# Patient Record
Sex: Male | Born: 1955 | Race: White | Hispanic: No | Marital: Married | Smoking: Former smoker
Health system: Southern US, Community
[De-identification: ages and names within clinical notes are randomized; demographics above are authoritative.]

## PROBLEM LIST (undated history)

## (undated) DIAGNOSIS — M1990 Unspecified osteoarthritis, unspecified site: Secondary | ICD-10-CM

## (undated) DIAGNOSIS — E039 Hypothyroidism, unspecified: Secondary | ICD-10-CM

## (undated) DIAGNOSIS — F209 Schizophrenia, unspecified: Secondary | ICD-10-CM

## (undated) DIAGNOSIS — F319 Bipolar disorder, unspecified: Secondary | ICD-10-CM

## (undated) DIAGNOSIS — E785 Hyperlipidemia, unspecified: Secondary | ICD-10-CM

## (undated) DIAGNOSIS — J449 Chronic obstructive pulmonary disease, unspecified: Secondary | ICD-10-CM

## (undated) HISTORY — DX: Hypothyroidism, unspecified: E03.9

## (undated) HISTORY — DX: Hyperlipidemia, unspecified: E78.5

## (undated) HISTORY — DX: Unspecified osteoarthritis, unspecified site: M19.90

## (undated) HISTORY — DX: Chronic obstructive pulmonary disease, unspecified: J44.9

## (undated) HISTORY — DX: Schizophrenia, unspecified: F20.9

## (undated) HISTORY — DX: Bipolar disorder, unspecified: F31.9

## (undated) HISTORY — PX: THYROIDECTOMY: SHX17

---

## 2003-05-06 ENCOUNTER — Inpatient Hospital Stay (HOSPITAL_COMMUNITY): Admission: AD | Admit: 2003-05-06 | Discharge: 2003-05-10 | Payer: Self-pay | Admitting: Psychiatry

## 2008-03-18 ENCOUNTER — Ambulatory Visit: Payer: Self-pay | Admitting: Psychiatry

## 2008-03-18 ENCOUNTER — Inpatient Hospital Stay (HOSPITAL_COMMUNITY): Admission: AD | Admit: 2008-03-18 | Discharge: 2008-03-29 | Payer: Self-pay | Admitting: Psychiatry

## 2010-05-19 NOTE — Discharge Summary (Signed)
[name], [name] NO.:  [id number]   MEDICAL RECORD NO.:  [id number]          PATIENT TYPE:  IPS   LOCATION:  0406                          FACILITY:  BH   PHYSICIAN:  Anselm Jungling, MD  DATE OF BIRTH:  04-Aug-1955   DATE OF ADMISSION:  03/18/2008  DATE OF DISCHARGE:  03/29/2008                               DISCHARGE SUMMARY   IDENTIFYING DATA AND REASON FOR ADMISSION:  This is an inpatient  psychiatric admission for Dustin Barton, a 55 year old unmarried Caucasian  male who lives by himself in [place], with his parents nearby.  He is a  patient of Dr. Cheree Ditto at [facility].  He was admitted due to  increasing symptoms of his chronic psychotic disorder.  Please refer to  the admission note for further details pertaining to the symptoms,  circumstances and history that led to his hospitalization.  He was given  an initial Axis I diagnosis of schizophrenia, NOS, acute exacerbation.   MEDICAL AND LABORATORY:  The patient has a history of hypothyroidism.  He was medically and physically assessed by the psychiatric nurse  practitioner.  He was in good health without any active or chronic  medical problems.  He was continued on Synthroid 25 mcg daily.  There  were no significant medical issues.   HOSPITAL COURSE:  The patient was admitted to the adult inpatient  psychiatric service.  He presented as a well-nourished, normally-  developed adult male who stated A witch told me I was [employer].  I  hear voices.  The patient was fairly well groomed and dressed,  extremely pleasant and engageable, and friendly.  His ideation was  delusional as above.  His mood was mostly neutral, but there was a  question of possible hypomania, as he was quite animated and mildly  euphoric.  He was agreeable to treatment here, even though his insight  was questionable.  He gave [place] permission to contact his parents in  [place].   He was treated with psychotropic  regimen that included Celexa and  Abilify, which he had been taking previously.   Over the course of his hospital stay, his picture became more apparently  one of moderate hypomania, with persistent euphoria, expansiveness, and  religiosity.  He was extremely pleasant and helpful on the unit, and  showed a great deal of empathy and care for other patients, and was  constantly offering to be helpful in whatever way he could.  He was  never irritable.  He did not verbalize other delusional ideation, but  his thoughts tended to be somewhat rambling and circumstantial.  Because  of this euphoric/hypomanic picture, it was felt that a trial of an  antimanic, mood-stabilizing medication was in order, and as such, a  trial of Depakote was undertaken.  This seemed to sufficiently address  the hypomania and euphoria.  He continued, however, extremely pleasant,  helpful, and engageable.  Sleep and appetite stabilized nicely.   Abilify was 40 mg daily which also was well tolerated and appeared to be  beneficial.  He reported auditory hallucinations at times during his  hospital stay, but they had diminished significantly.   Case management was in touch with the patient's parents.  They indicated  they were not interested in coming to the hospital for a family session.  However, there were in contact with the patient and evidently quite  supportive.   On the 12th hospital day, the patient appeared appropriate for  discharge.  He was in good spirits, but not euphoric or hypomanic any  longer, and his thoughts were better organized.  He appeared to have  more insight.  He agreed with the following aftercare plan.   AFTERCARE:  The patient was to follow up at [facility] with  an appointment on April 01, 2008 at 9 a.m.   DISCHARGE MEDICATIONS:  1. Celexa 20 mg daily.  2. Synthroid 25 mcg daily.  3. Protonix 40 mg daily.  4. Depakote 1000 mg bedtime.  5. Abilify 40 mg q.a.m.    DISCHARGE DIAGNOSES:  Axis I:  Bipolar disorder, not otherwise  specified, most recently hypomanic.  Axis II:  Deferred.  Axis III:  History of hypothyroidism, gastroesophageal reflux disease.  Axis IV:  Stressors, severe.  Axis V:  GAF on discharge 50.   The patient was also referred to therapeutic alternatives for ongoing  community support.      Anselm Jungling, MD  Electronically Signed     SPB/MEDQ  D:  03/29/2008  T:  03/29/2008  Job:  [phone]

## 2010-05-19 NOTE — H&P (Signed)
NAME:  Dustin Barton, Dustin Barton NO.:  [id number]   MEDICAL RECORD NO.:  [id number]          PATIENT TYPE:  IPS   LOCATION:  0402                          FACILITY:  BH   PHYSICIAN:  Anselm Jungling, MD  DATE OF BIRTH:  1955-10-28   DATE OF ADMISSION:  03/18/2008  DATE OF DISCHARGE:                       PSYCHIATRIC ADMISSION ASSESSMENT   HISTORY OF PRESENT ILLNESS:  The patient is here on petition with papers  stating that patient presented to the police department complaining of  hearing voices, saying the voices are telling him to say the devil, not  God, believing the devil is causing the voices and wanting to harm him.  The petition papers also state that law enforcement officers were called  to the [facility] as respondent was having an interaction with an  older man who he thought was homosexual and wanting to hurt him.  The  patient also was talking about Porfirio Oar and [facility].  In  addition, patient's judgment and impulse control are impaired making him  a danger to himself and others.  The patient reports having some recent  medication changes and having some stressors with his neighbors who he  states are using.  He denies any other stressors or any history of  substances.   PAST PSYCHIATRIC HISTORY:  First admission to [facility].  He is a client at [facility] in [place].   SOCIAL HISTORY:  This 55 year old man lives alone in [place].  He is on  disability.   FAMILY HISTORY:  None that we are aware of.   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   PRIMARY CARE Edrian Melucci:  Dr. Celine Mans   MEDICAL PROBLEMS:  Hypothyroidism.   MEDICATIONS:  Listed as the following:  1. Abilify 30 daily.  2. Synthroid 25 mcg daily.  3. Celexa 20 mg.  4. Reporting being on a sleep aid that he is unable to recall the name      of.  He reports compliance with his medications.   DRUG  ALLERGIES:  HALDOL.   PHYSICAL EXAMINATION:  GENERAL:  This is a middle-aged male who was  fully assessed at [facility].  We also note that the patient  received Geodon and Ativan. They state the patient was uncooperative  when he initially presented to the ED, refusing to undress.  RIGHT UPPER EXTREMITY:  His physical exam also showed an open wound to  his right palm near the wrist, and he states he had hurt himself trying  to kill a fly that was [place].  It appears to be healing, no signs  of erythema or any drainage; also noted in [facility] that there  was some discoloration of his arms.   At [facility], the patient denies any distress today.  He  appears well nourished.   The patient also received a tetanus injection.   LABORATORY DATA:  Shows an RPR that is negative.  Urine drug screen is  negative.  WBC count was at 11.  TSH is 1.28.  Alcohol  level was less  than 0.01, and his CMET shows a glucose of 121.   MENTAL STATUS EXAMINATION:  He is cooperative, neat in appearance,  casually dressed.  He is calm, introduces himself.  He has good eye  contact.  His speech is soft-spoken.  The patient's mood is neutral.  He  states he is feeling better today.  Affect:  Again, he is calm and  cooperative.  Thought process:  Delusional thinking, some religious  preoccupation, denies any suicidal ideation.  Cognitive function:  The  patient is aware of self and situation.  Judgment and insight are poor  at this time.   Axis I:  Psychosis, not otherwise specified, rule out schizoaffective  disorder.  Axis II:  Deferred.  Axis III:  Hypothyroidism.  Axis IV:  Other psychosocial problems related to burden of illness.  Axis V:  Current is 25-30.   PLAN:  Our plan is to contract for safety.  Will resume his medications.  Reinforce medication compliance and followup.  Will continue to gather  more history; identify stressors.  Case manager will obtain  follow-up  appointments.  His tentative length of stay at this time is 3-5 days.      Landry Corporal, N.P.      Anselm Jungling, MD  Electronically Signed    JO/MEDQ  D:  03/19/2008  T:  03/19/2008  Job:  [id number]

## 2010-05-22 NOTE — Discharge Summary (Signed)
Dustin Barton, Dustin Barton                          ACCOUNT NO.:  [id number]   MEDICAL RECORD NO.:  [id number]                   PATIENT TYPE:  IPS   LOCATION:  0400                                 FACILITY:  BH   PHYSICIAN:  Geoffery Lyons, M.D.                   DATE OF BIRTH:  January 06, 1955   DATE OF ADMISSION:  05/06/2003  DATE OF DISCHARGE:  05/10/2003                                 DISCHARGE SUMMARY   CHIEF COMPLAINT AND PRESENT ILLNESS:  This was the first admission to [facility] for this 55 year old single white male involuntarily  committed.  History of psychosis, seeing things.  The commitment papers  stated that he was paranoid with visual hallucinations, afraid someone will  throw him in the water to be eaten by sharks.  He apparently was seeing old  friends with hands coming through the coat rack, making deals with them,  feeling paranoid, knew something was wrong, thinking may have taken more of  Wellbutrin after different dosages.  Worried about his cat being alone.  Difficulty with sleep, feeling anxious.   PAST PSYCHIATRIC HISTORY:  First time at [facility].  Sees Dr.  Cheree Ditto.  Attends NA meetings.   ALCOHOL/DRUG HISTORY:  Reported use of Valium.  Other substances but claims  sobriety for 11 years.   MEDICAL HISTORY:  Hypothyroidism.   MEDICATIONS:  Wellbutrin XL 300 mg a day.   PHYSICAL EXAMINATION:  Performed and failed to show any acute findings.   MENTAL STATUS EXAM:  Alert, cooperative male with fair eye contact.  Speech  evidenced some slowing, some psychomotor retardation, some maybe thought-  blocking but mood was anxious.  Affect was anxious.  He was pleasant,  polite.  Thought processes positive for paranoia.  Endorsing positive visual  hallucinations.  Cognition well-preserved.   LABORATORY DATA:  CBC within normal limits.  Blood chemistry within normal  limits.  Liver profile within normal limits.  Drug screen negative for  substances of abuse.  TSH was 4.534.  Lithium, on May 06, 2003, was 0.99 and,  on May 10, 2003, was 1.12.   ADMISSION DIAGNOSES:   AXIS I:  Rule out schizoaffective disorder.   AXIS II:  No diagnosis.   AXIS III:  Hypothyroidism.   AXIS IV:  Moderate.   AXIS V:  Global Assessment of Functioning upon admission 25; highest Global  Assessment of Functioning in the last year 55.   HOSPITAL COURSE:  He was admitted and started intensive individual and group  psychotherapy.  He was maintained on Wellbutrin XL 300 mg in the morning,  Levoxyl 0.025 mg daily, Cogentin 1 mg twice a day as needed, Navane 10 mg in  the morning, 20 mg at night, Ambien 10 mg at bedtime for sleep, lithium 300  mg, 1 three times a day.  The lithium dose was reassessed and he was taking  3  twice a day for a total of 1800 mg.  Initially, he was very superficial,  very disorganized, not focusing on what was going on, what was going on with  his recovery.  He was willing to take the medication.  He was very worried.  Somewhat expansive, some pressured speech.  On May 09, 2003, he was less  disorganized, compliant with medication, was willing to continue them.  Felt  like his family was supportive.  Still concerned about his cat, said he  wanted to be discharged to take care of his cat.  He continued to get better  and, on May 10, 2003, he was in full contact with reality.  Mostly baseline.  No suicidal ideation.  No homicidal ideation.  Endorsed no hallucinations.  Worried about the cat.  Wanted to be discharged to get home and take care of  him.  Endorsed no suicidal or homicidal ideation.  We went ahead and  discharged to outpatient follow-up.   DISCHARGE DIAGNOSES:   AXIS I:  Schizoaffective disorder.   AXIS II:  No diagnosis.   AXIS III:  Hypothyroidism.   AXIS IV:  Moderate.   AXIS V:  Global Assessment of Functioning upon discharge 50.   DISCHARGE MEDICATIONS:  1. Wellbutrin XL 300 mg, 1 daily.  2.  Synthroid 25 mcg daily.  3. Navane 10 mg in the morning and 20 mg at night.  4. Lithium carbonate 300 mg three times a day.  5. Cogentin 1 mg twice a day.  6. Ambien 10 mg at bedtime for sleep.   FOLLOW UP:  [facility].                                               Geoffery Lyons, M.D.    IL/MEDQ  D:  06/05/2003  T:  06/06/2003  Job:  [id number]

## 2011-01-06 DIAGNOSIS — F259 Schizoaffective disorder, unspecified: Secondary | ICD-10-CM | POA: Diagnosis not present

## 2011-02-10 DIAGNOSIS — Z79899 Other long term (current) drug therapy: Secondary | ICD-10-CM | POA: Diagnosis not present

## 2011-02-10 DIAGNOSIS — E039 Hypothyroidism, unspecified: Secondary | ICD-10-CM | POA: Diagnosis not present

## 2011-02-10 DIAGNOSIS — Z125 Encounter for screening for malignant neoplasm of prostate: Secondary | ICD-10-CM | POA: Diagnosis not present

## 2011-02-10 DIAGNOSIS — E785 Hyperlipidemia, unspecified: Secondary | ICD-10-CM | POA: Diagnosis not present

## 2011-02-10 DIAGNOSIS — G40909 Epilepsy, unspecified, not intractable, without status epilepticus: Secondary | ICD-10-CM | POA: Diagnosis not present

## 2011-02-15 DIAGNOSIS — M159 Polyosteoarthritis, unspecified: Secondary | ICD-10-CM | POA: Diagnosis not present

## 2011-02-15 DIAGNOSIS — K7689 Other specified diseases of liver: Secondary | ICD-10-CM | POA: Diagnosis not present

## 2011-02-15 DIAGNOSIS — J449 Chronic obstructive pulmonary disease, unspecified: Secondary | ICD-10-CM | POA: Diagnosis not present

## 2011-02-15 DIAGNOSIS — M722 Plantar fascial fibromatosis: Secondary | ICD-10-CM | POA: Diagnosis not present

## 2011-02-16 DIAGNOSIS — M79609 Pain in unspecified limb: Secondary | ICD-10-CM | POA: Diagnosis not present

## 2011-02-16 DIAGNOSIS — M7730 Calcaneal spur, unspecified foot: Secondary | ICD-10-CM | POA: Diagnosis not present

## 2011-02-16 DIAGNOSIS — M898X9 Other specified disorders of bone, unspecified site: Secondary | ICD-10-CM | POA: Diagnosis not present

## 2011-02-24 DIAGNOSIS — M722 Plantar fascial fibromatosis: Secondary | ICD-10-CM | POA: Diagnosis not present

## 2011-03-15 DIAGNOSIS — J449 Chronic obstructive pulmonary disease, unspecified: Secondary | ICD-10-CM | POA: Diagnosis not present

## 2011-03-15 DIAGNOSIS — K7689 Other specified diseases of liver: Secondary | ICD-10-CM | POA: Diagnosis not present

## 2011-03-15 DIAGNOSIS — M159 Polyosteoarthritis, unspecified: Secondary | ICD-10-CM | POA: Diagnosis not present

## 2011-03-15 DIAGNOSIS — E039 Hypothyroidism, unspecified: Secondary | ICD-10-CM | POA: Diagnosis not present

## 2011-03-24 DIAGNOSIS — M722 Plantar fascial fibromatosis: Secondary | ICD-10-CM | POA: Diagnosis not present

## 2011-05-10 DIAGNOSIS — H2510 Age-related nuclear cataract, unspecified eye: Secondary | ICD-10-CM | POA: Diagnosis not present

## 2011-05-17 DIAGNOSIS — E039 Hypothyroidism, unspecified: Secondary | ICD-10-CM | POA: Diagnosis not present

## 2011-05-17 DIAGNOSIS — E785 Hyperlipidemia, unspecified: Secondary | ICD-10-CM | POA: Diagnosis not present

## 2011-05-17 DIAGNOSIS — I10 Essential (primary) hypertension: Secondary | ICD-10-CM | POA: Diagnosis not present

## 2011-05-17 DIAGNOSIS — Z79899 Other long term (current) drug therapy: Secondary | ICD-10-CM | POA: Diagnosis not present

## 2011-05-17 DIAGNOSIS — K7689 Other specified diseases of liver: Secondary | ICD-10-CM | POA: Diagnosis not present

## 2011-05-28 DIAGNOSIS — F259 Schizoaffective disorder, unspecified: Secondary | ICD-10-CM | POA: Diagnosis not present

## 2011-07-16 DIAGNOSIS — F259 Schizoaffective disorder, unspecified: Secondary | ICD-10-CM | POA: Diagnosis not present

## 2011-08-13 DIAGNOSIS — R5383 Other fatigue: Secondary | ICD-10-CM | POA: Diagnosis not present

## 2011-08-13 DIAGNOSIS — R5381 Other malaise: Secondary | ICD-10-CM | POA: Diagnosis not present

## 2011-08-13 DIAGNOSIS — E669 Obesity, unspecified: Secondary | ICD-10-CM | POA: Diagnosis not present

## 2011-08-13 DIAGNOSIS — G4733 Obstructive sleep apnea (adult) (pediatric): Secondary | ICD-10-CM | POA: Diagnosis not present

## 2011-08-23 DIAGNOSIS — R609 Edema, unspecified: Secondary | ICD-10-CM | POA: Diagnosis not present

## 2011-08-23 DIAGNOSIS — I872 Venous insufficiency (chronic) (peripheral): Secondary | ICD-10-CM | POA: Diagnosis not present

## 2011-08-23 DIAGNOSIS — M722 Plantar fascial fibromatosis: Secondary | ICD-10-CM | POA: Diagnosis not present

## 2011-09-03 DIAGNOSIS — F259 Schizoaffective disorder, unspecified: Secondary | ICD-10-CM | POA: Diagnosis not present

## 2011-10-04 DIAGNOSIS — J209 Acute bronchitis, unspecified: Secondary | ICD-10-CM | POA: Diagnosis not present

## 2011-10-04 DIAGNOSIS — I8310 Varicose veins of unspecified lower extremity with inflammation: Secondary | ICD-10-CM | POA: Diagnosis not present

## 2011-10-04 DIAGNOSIS — F209 Schizophrenia, unspecified: Secondary | ICD-10-CM | POA: Diagnosis not present

## 2011-10-04 DIAGNOSIS — E039 Hypothyroidism, unspecified: Secondary | ICD-10-CM | POA: Diagnosis not present

## 2011-10-04 DIAGNOSIS — R609 Edema, unspecified: Secondary | ICD-10-CM | POA: Diagnosis not present

## 2011-10-04 DIAGNOSIS — Z6841 Body Mass Index (BMI) 40.0 and over, adult: Secondary | ICD-10-CM | POA: Diagnosis not present

## 2011-10-07 DIAGNOSIS — Z6841 Body Mass Index (BMI) 40.0 and over, adult: Secondary | ICD-10-CM | POA: Diagnosis not present

## 2011-10-07 DIAGNOSIS — L270 Generalized skin eruption due to drugs and medicaments taken internally: Secondary | ICD-10-CM | POA: Diagnosis not present

## 2011-10-15 DIAGNOSIS — G4733 Obstructive sleep apnea (adult) (pediatric): Secondary | ICD-10-CM | POA: Diagnosis not present

## 2011-10-15 DIAGNOSIS — Z23 Encounter for immunization: Secondary | ICD-10-CM | POA: Diagnosis not present

## 2011-10-15 DIAGNOSIS — F2089 Other schizophrenia: Secondary | ICD-10-CM | POA: Diagnosis not present

## 2011-10-15 DIAGNOSIS — J449 Chronic obstructive pulmonary disease, unspecified: Secondary | ICD-10-CM | POA: Diagnosis not present

## 2011-10-29 DIAGNOSIS — F259 Schizoaffective disorder, unspecified: Secondary | ICD-10-CM | POA: Diagnosis not present

## 2011-11-26 DIAGNOSIS — F259 Schizoaffective disorder, unspecified: Secondary | ICD-10-CM | POA: Diagnosis not present

## 2011-12-22 DIAGNOSIS — R300 Dysuria: Secondary | ICD-10-CM | POA: Diagnosis not present

## 2011-12-22 DIAGNOSIS — E869 Volume depletion, unspecified: Secondary | ICD-10-CM | POA: Diagnosis not present

## 2011-12-22 DIAGNOSIS — R51 Headache: Secondary | ICD-10-CM | POA: Diagnosis not present

## 2012-01-07 DIAGNOSIS — F259 Schizoaffective disorder, unspecified: Secondary | ICD-10-CM | POA: Diagnosis not present

## 2012-01-12 DIAGNOSIS — E039 Hypothyroidism, unspecified: Secondary | ICD-10-CM | POA: Diagnosis not present

## 2012-01-12 DIAGNOSIS — E785 Hyperlipidemia, unspecified: Secondary | ICD-10-CM | POA: Diagnosis not present

## 2012-01-12 DIAGNOSIS — Z6841 Body Mass Index (BMI) 40.0 and over, adult: Secondary | ICD-10-CM | POA: Diagnosis not present

## 2012-01-12 DIAGNOSIS — Z79899 Other long term (current) drug therapy: Secondary | ICD-10-CM | POA: Diagnosis not present

## 2012-01-12 DIAGNOSIS — F209 Schizophrenia, unspecified: Secondary | ICD-10-CM | POA: Diagnosis not present

## 2012-01-12 DIAGNOSIS — M1990 Unspecified osteoarthritis, unspecified site: Secondary | ICD-10-CM | POA: Diagnosis not present

## 2012-01-12 DIAGNOSIS — J449 Chronic obstructive pulmonary disease, unspecified: Secondary | ICD-10-CM | POA: Diagnosis not present

## 2012-03-17 DIAGNOSIS — F259 Schizoaffective disorder, unspecified: Secondary | ICD-10-CM | POA: Diagnosis not present

## 2012-03-20 DIAGNOSIS — S91109A Unspecified open wound of unspecified toe(s) without damage to nail, initial encounter: Secondary | ICD-10-CM | POA: Diagnosis not present

## 2012-05-10 DIAGNOSIS — J449 Chronic obstructive pulmonary disease, unspecified: Secondary | ICD-10-CM | POA: Diagnosis not present

## 2012-05-10 DIAGNOSIS — E039 Hypothyroidism, unspecified: Secondary | ICD-10-CM | POA: Diagnosis not present

## 2012-05-10 DIAGNOSIS — M1990 Unspecified osteoarthritis, unspecified site: Secondary | ICD-10-CM | POA: Diagnosis not present

## 2012-05-10 DIAGNOSIS — F209 Schizophrenia, unspecified: Secondary | ICD-10-CM | POA: Diagnosis not present

## 2012-05-10 DIAGNOSIS — Z125 Encounter for screening for malignant neoplasm of prostate: Secondary | ICD-10-CM | POA: Diagnosis not present

## 2012-05-10 DIAGNOSIS — Z6841 Body Mass Index (BMI) 40.0 and over, adult: Secondary | ICD-10-CM | POA: Diagnosis not present

## 2012-05-10 DIAGNOSIS — E785 Hyperlipidemia, unspecified: Secondary | ICD-10-CM | POA: Diagnosis not present

## 2012-05-10 DIAGNOSIS — Z79899 Other long term (current) drug therapy: Secondary | ICD-10-CM | POA: Diagnosis not present

## 2012-06-05 DIAGNOSIS — Z1211 Encounter for screening for malignant neoplasm of colon: Secondary | ICD-10-CM | POA: Diagnosis not present

## 2012-06-09 DIAGNOSIS — F259 Schizoaffective disorder, unspecified: Secondary | ICD-10-CM | POA: Diagnosis not present

## 2012-06-20 DIAGNOSIS — Z8371 Family history of colonic polyps: Secondary | ICD-10-CM | POA: Diagnosis not present

## 2012-06-20 DIAGNOSIS — E039 Hypothyroidism, unspecified: Secondary | ICD-10-CM | POA: Diagnosis not present

## 2012-06-20 DIAGNOSIS — Z1211 Encounter for screening for malignant neoplasm of colon: Secondary | ICD-10-CM | POA: Diagnosis not present

## 2012-06-20 DIAGNOSIS — Z7982 Long term (current) use of aspirin: Secondary | ICD-10-CM | POA: Diagnosis not present

## 2012-06-20 DIAGNOSIS — K5730 Diverticulosis of large intestine without perforation or abscess without bleeding: Secondary | ICD-10-CM | POA: Diagnosis not present

## 2012-06-20 DIAGNOSIS — E7800 Pure hypercholesterolemia, unspecified: Secondary | ICD-10-CM | POA: Diagnosis not present

## 2012-06-20 DIAGNOSIS — Z79899 Other long term (current) drug therapy: Secondary | ICD-10-CM | POA: Diagnosis not present

## 2012-06-20 DIAGNOSIS — F209 Schizophrenia, unspecified: Secondary | ICD-10-CM | POA: Diagnosis not present

## 2012-06-20 DIAGNOSIS — Z87891 Personal history of nicotine dependence: Secondary | ICD-10-CM | POA: Diagnosis not present

## 2012-06-20 DIAGNOSIS — K219 Gastro-esophageal reflux disease without esophagitis: Secondary | ICD-10-CM | POA: Diagnosis not present

## 2012-06-20 DIAGNOSIS — K644 Residual hemorrhoidal skin tags: Secondary | ICD-10-CM | POA: Diagnosis not present

## 2012-06-20 DIAGNOSIS — K2270 Barrett's esophagus without dysplasia: Secondary | ICD-10-CM | POA: Diagnosis not present

## 2012-06-20 DIAGNOSIS — K449 Diaphragmatic hernia without obstruction or gangrene: Secondary | ICD-10-CM | POA: Diagnosis not present

## 2012-06-20 DIAGNOSIS — K648 Other hemorrhoids: Secondary | ICD-10-CM | POA: Diagnosis not present

## 2012-06-20 DIAGNOSIS — D126 Benign neoplasm of colon, unspecified: Secondary | ICD-10-CM | POA: Diagnosis not present

## 2012-08-02 DIAGNOSIS — K648 Other hemorrhoids: Secondary | ICD-10-CM | POA: Diagnosis not present

## 2012-08-02 DIAGNOSIS — K5730 Diverticulosis of large intestine without perforation or abscess without bleeding: Secondary | ICD-10-CM | POA: Diagnosis not present

## 2012-08-02 DIAGNOSIS — K2270 Barrett's esophagus without dysplasia: Secondary | ICD-10-CM | POA: Diagnosis not present

## 2012-08-02 DIAGNOSIS — K219 Gastro-esophageal reflux disease without esophagitis: Secondary | ICD-10-CM | POA: Diagnosis not present

## 2012-08-31 DIAGNOSIS — F259 Schizoaffective disorder, unspecified: Secondary | ICD-10-CM | POA: Diagnosis not present

## 2012-09-11 DIAGNOSIS — Z79899 Other long term (current) drug therapy: Secondary | ICD-10-CM | POA: Diagnosis not present

## 2012-09-11 DIAGNOSIS — M1990 Unspecified osteoarthritis, unspecified site: Secondary | ICD-10-CM | POA: Diagnosis not present

## 2012-09-11 DIAGNOSIS — E039 Hypothyroidism, unspecified: Secondary | ICD-10-CM | POA: Diagnosis not present

## 2012-09-11 DIAGNOSIS — F209 Schizophrenia, unspecified: Secondary | ICD-10-CM | POA: Diagnosis not present

## 2012-09-11 DIAGNOSIS — J449 Chronic obstructive pulmonary disease, unspecified: Secondary | ICD-10-CM | POA: Diagnosis not present

## 2012-09-11 DIAGNOSIS — E785 Hyperlipidemia, unspecified: Secondary | ICD-10-CM | POA: Diagnosis not present

## 2012-11-09 DIAGNOSIS — H2510 Age-related nuclear cataract, unspecified eye: Secondary | ICD-10-CM | POA: Diagnosis not present

## 2012-11-16 DIAGNOSIS — F259 Schizoaffective disorder, unspecified: Secondary | ICD-10-CM | POA: Diagnosis not present

## 2012-12-18 DIAGNOSIS — R609 Edema, unspecified: Secondary | ICD-10-CM | POA: Diagnosis not present

## 2012-12-18 DIAGNOSIS — M1990 Unspecified osteoarthritis, unspecified site: Secondary | ICD-10-CM | POA: Diagnosis not present

## 2012-12-18 DIAGNOSIS — M25673 Stiffness of unspecified ankle, not elsewhere classified: Secondary | ICD-10-CM | POA: Diagnosis not present

## 2012-12-18 DIAGNOSIS — M722 Plantar fascial fibromatosis: Secondary | ICD-10-CM | POA: Diagnosis not present

## 2013-01-11 DIAGNOSIS — E039 Hypothyroidism, unspecified: Secondary | ICD-10-CM | POA: Diagnosis not present

## 2013-01-11 DIAGNOSIS — J449 Chronic obstructive pulmonary disease, unspecified: Secondary | ICD-10-CM | POA: Diagnosis not present

## 2013-01-11 DIAGNOSIS — E785 Hyperlipidemia, unspecified: Secondary | ICD-10-CM | POA: Diagnosis not present

## 2013-02-13 ENCOUNTER — Ambulatory Visit (INDEPENDENT_AMBULATORY_CARE_PROVIDER_SITE_OTHER): Payer: Self-pay | Admitting: Critical Care Medicine

## 2013-02-13 ENCOUNTER — Encounter: Payer: Self-pay | Admitting: Critical Care Medicine

## 2013-02-13 VITALS — BP 118/76 | HR 73 | Temp 98.3°F | Ht 69.0 in | Wt 312.0 lb

## 2013-02-13 DIAGNOSIS — J449 Chronic obstructive pulmonary disease, unspecified: Secondary | ICD-10-CM | POA: Diagnosis not present

## 2013-02-13 DIAGNOSIS — M1990 Unspecified osteoarthritis, unspecified site: Secondary | ICD-10-CM | POA: Insufficient documentation

## 2013-02-13 DIAGNOSIS — R0602 Shortness of breath: Secondary | ICD-10-CM | POA: Diagnosis not present

## 2013-02-13 DIAGNOSIS — J4541 Moderate persistent asthma with (acute) exacerbation: Secondary | ICD-10-CM | POA: Insufficient documentation

## 2013-02-13 DIAGNOSIS — E039 Hypothyroidism, unspecified: Secondary | ICD-10-CM | POA: Insufficient documentation

## 2013-02-13 DIAGNOSIS — R059 Cough, unspecified: Secondary | ICD-10-CM | POA: Diagnosis not present

## 2013-02-13 DIAGNOSIS — R05 Cough: Secondary | ICD-10-CM | POA: Diagnosis not present

## 2013-02-13 DIAGNOSIS — F319 Bipolar disorder, unspecified: Secondary | ICD-10-CM | POA: Insufficient documentation

## 2013-02-13 DIAGNOSIS — F209 Schizophrenia, unspecified: Secondary | ICD-10-CM | POA: Insufficient documentation

## 2013-02-13 DIAGNOSIS — R0609 Other forms of dyspnea: Secondary | ICD-10-CM | POA: Diagnosis not present

## 2013-02-13 DIAGNOSIS — E785 Hyperlipidemia, unspecified: Secondary | ICD-10-CM | POA: Insufficient documentation

## 2013-02-13 DIAGNOSIS — M47814 Spondylosis without myelopathy or radiculopathy, thoracic region: Secondary | ICD-10-CM | POA: Diagnosis not present

## 2013-02-13 MED ORDER — TIOTROPIUM BROMIDE MONOHYDRATE 18 MCG IN CAPS: 18.0000 ug | ORAL_CAPSULE | Freq: Every day | RESPIRATORY_TRACT | Status: DC

## 2013-02-13 NOTE — Progress Notes (Signed)
Subjective:    Patient ID: Dustin Barton, male    DOB: April 09, 1955, 58 y.o.   MRN: [id number]  HPI Comments: Dx osa, last ov dx COPD. No Rx as of yet. Not using inhalers as of yet. Pt more dyspneic over several years. Pt did lose weight, now increased.  On nasal oxygen QHS, not on cpap. DME: [facility] Pt  Shortness of Breath This is a chronic problem. The current episode started more than 1 year ago. The problem occurs daily (pt is dyspneic if bend down, or exertion. worse up stairs). The problem has been gradually worsening (over one year). Associated symptoms include chest pain, ear pain, headaches, leg swelling, neck pain, sputum production and wheezing. Pertinent negatives include no abdominal pain, claudication, coryza, fever, hemoptysis, leg pain, orthopnea, PND, rash, rhinorrhea, sore throat, swollen glands, syncope or vomiting. The symptoms are aggravated by weather changes, any activity, exercise, fumes and odors. Associated symptoms comments: Pt gets phlegm, and will get white mucus. Risk factors include smoking. His past medical history is significant for COPD. There is no history of allergies, aspirin allergies, asthma, bronchiolitis, CAD, chronic lung disease, DVT, a heart failure, PE, pneumonia or a recent surgery.    Past Medical History  Diagnosis Date  . Osteoarthritis   . COPD (chronic obstructive pulmonary disease)   . Hypothyroidism   . Schizophrenia   . Hyperlipidemia   . Bipolar 1 disorder      Family History  Problem Relation Age of Onset  . Skin cancer Father   . Diabetes Sister   . Hepatitis Brother   . Lung disease Sister      History   Social History  . Marital Status: Married    Spouse Name: N/A    Number of Children: N/A  . Years of Education: N/A   Occupational History  . Not on file.   Social History Main Topics  . Smoking status: Former Smoker -- 2.00 packs/day for 34 years    Types: Cigarettes    Start date: 02/05/2012  . Smokeless  tobacco: Not on file  . Alcohol Use: No     Comment: quit  . Drug Use: No     Comment: quit   . Sexual Activity: Not on file   Other Topics Concern  . Not on file   Social History Narrative  . No narrative on file     Allergies  Allergen Reactions  . Artane [Trihexyphenidyl]   . Haldol [Haloperidol]      No outpatient prescriptions prior to visit.   No facility-administered medications prior to visit.      Review of Systems  Constitutional: Negative for fever, chills, diaphoresis, activity change, appetite change, fatigue and unexpected weight change.  HENT: Positive for ear pain, postnasal drip, sinus pressure and voice change. Negative for congestion, dental problem, ear discharge, facial swelling, hearing loss, mouth sores, nosebleeds, rhinorrhea, sneezing, sore throat, tinnitus and trouble swallowing.   Eyes: Negative for photophobia, discharge, itching and visual disturbance.  Respiratory: Positive for cough, sputum production, choking, chest tightness, shortness of breath and wheezing. Negative for apnea, hemoptysis and stridor.   Cardiovascular: Positive for chest pain and leg swelling. Negative for palpitations, orthopnea, claudication, syncope and PND.  Gastrointestinal: Negative for nausea, vomiting, abdominal pain, constipation, blood in stool and abdominal distention.  Genitourinary: Negative for dysuria, urgency, frequency, hematuria, flank pain, decreased urine volume and difficulty urinating.  Musculoskeletal: Positive for neck pain. Negative for arthralgias, back pain, gait problem, joint swelling,  myalgias and neck stiffness.  Skin: Negative for color change, pallor and rash.  Neurological: Positive for dizziness, tremors, light-headedness and headaches. Negative for seizures, syncope, speech difficulty, weakness and numbness.  Hematological: Negative for adenopathy. Does not bruise/bleed easily.  Psychiatric/Behavioral: Negative for confusion, sleep  disturbance and agitation. The patient is not nervous/anxious.        Objective:   Physical Exam Filed Vitals:   02/13/13 1448  BP: 118/76  Pulse: 73  Temp: 98.3 F (36.8 C)  TempSrc: Oral  Height: 5\' 9"  (1.753 m)  Weight: 141.522 kg (312 lb)  SpO2: 97%    Gen: Pleasant, obese, in no distress, anxious affect  ENT: No lesions,  mouth clear,  oropharynx clear, no postnasal drip  Neck: No JVD, no TMG, no carotid bruits  Lungs: No use of accessory muscles, no dullness to percussion, distant BS. No wheeze  Cardiovascular: RRR, heart sounds normal, no murmur or gallops, no peripheral edema  Abdomen: soft and NT, no HSM,  BS normal  Musculoskeletal: No deformities, no cyanosis or clubbing  Neuro: alert, non focal  Skin: Warm, no lesions or rashes  No results found.        Assessment & Plan:   COPD (chronic obstructive pulmonary disease) Copd by exam.  No recent PFTs or CXR.  Dyspnea most consistent with obstructive airway disease Plan Start Spiriva daily Obtain CXR and PFTs. Return 2 months   Updated Medication List Outpatient Encounter Prescriptions as of 02/13/2013  Medication Sig  . aspirin 325 MG tablet Take 325 mg by mouth 2 (two) times daily.  . benztropine (COGENTIN) 2 MG tablet Take 1 tablet by mouth 2 (two) times daily.  . citalopram (CELEXA) 20 MG tablet Take 1 tablet by mouth daily.  . cyanocobalamin 2000 MCG tablet Take 2,000 mcg by mouth daily.  . divalproex (DEPAKOTE) 500 MG DR tablet Take 2 tablets by mouth at bedtime.  . fenofibrate micronized (LOFIBRA) 134 MG capsule Take 1 capsule by mouth daily.  Marland Kitchen ibuprofen (ADVIL,MOTRIN) 200 MG tablet Take 200 mg by mouth every 6 (six) hours as needed.  Marland Kitchen levothyroxine (SYNTHROID, LEVOTHROID) 25 MCG tablet Take 1 tablet by mouth daily.  . Multiple Vitamin (MULTIVITAMIN) tablet Take 1 tablet by mouth daily.  . Omega-3 Fatty Acids (FISH OIL) 1200 MG CAPS Take 1 capsule by mouth 2 (two) times daily.  Marland Kitchen  omeprazole (PRILOSEC) 20 MG capsule Take 1 capsule by mouth daily.  . ziprasidone (GEODON) 40 MG capsule Take 1 capsule by mouth daily.  . ziprasidone (GEODON) 80 MG capsule Take 80 mg by mouth at bedtime.  Marland Kitchen tiotropium (SPIRIVA HANDIHALER) 18 MCG inhalation capsule Place 1 capsule (18 mcg total) into inhaler and inhale daily.  Marland Kitchen tiotropium (SPIRIVA) 18 MCG inhalation capsule Place 1 capsule (18 mcg total) into inhaler and inhale daily.

## 2013-02-13 NOTE — Patient Instructions (Signed)
Chest xray today will be done Start Spiriva daily Return 2 months

## 2013-02-15 NOTE — Assessment & Plan Note (Signed)
Copd by exam.  No recent PFTs or CXR.  Dyspnea most consistent with obstructive airway disease Plan Start Spiriva daily Obtain CXR and PFTs. Return 2 months

## 2013-02-23 DIAGNOSIS — J449 Chronic obstructive pulmonary disease, unspecified: Secondary | ICD-10-CM | POA: Diagnosis not present

## 2013-02-23 LAB — PULMONARY FUNCTION TEST

## 2013-03-02 ENCOUNTER — Encounter: Payer: Self-pay | Admitting: *Deleted

## 2013-03-02 ENCOUNTER — Telehealth: Payer: Self-pay | Admitting: Critical Care Medicine

## 2013-03-02 NOTE — Telephone Encounter (Signed)
Dr. Joya Gaskins ordered cxr on pt to be done at [facility]. We have received results from cxr on 02/13/13. Per PW: Call pt and tell him cxr is normal.  Per pt's chart, his # is  [phone] - this # has been disconnected. Called pt's emergency contact at # provided in chart, [phone].  This # is no longer in service. Called Dr. Hale Bogus office, spoke with Jacqlyn Larsen as they are the referring office.  They have a contact # of [phone] - this # is "not valid" and [phone] - Spoke with a lady at this # and was advised I have the wrong #.  Per Jacqlyn Larsen, pt has him mom, Gwenn as the emergency contact.  # they have in his chart for her is [phone].  I called this # - went directly to msg "I'm sry this person has a Voicemail box that has not been set up yet.  Goodbye."    As I have tried to contact pt multiple times with no success, I will have results scanned in pt's chart and will send him a letter to the address we have on file asking to contact our office.

## 2013-03-05 DIAGNOSIS — F259 Schizoaffective disorder, unspecified: Secondary | ICD-10-CM | POA: Diagnosis not present

## 2013-03-09 ENCOUNTER — Telehealth: Payer: Self-pay | Admitting: Critical Care Medicine

## 2013-03-09 NOTE — Telephone Encounter (Signed)
Pt is aware of his CXR results.

## 2013-03-17 ENCOUNTER — Telehealth: Payer: Self-pay | Admitting: Critical Care Medicine

## 2013-03-17 DIAGNOSIS — J449 Chronic obstructive pulmonary disease, unspecified: Secondary | ICD-10-CM

## 2013-03-17 NOTE — Telephone Encounter (Signed)
Call pt and tell him pfts show mild obstruction in small airways that respond to bronchodilators Stay on spiriva

## 2013-03-19 NOTE — Telephone Encounter (Signed)
Called, spoke with pt.  Informed him of PFT results and recs per PW.  He verbalized understanding and voiced no further questions or concerns at this time.  Pt requesting to schedule his 2 mo f/u with PW in [place].  We scheduled this for April 21 at 4:30 pm.  Pt aware.

## 2013-03-30 DIAGNOSIS — F259 Schizoaffective disorder, unspecified: Secondary | ICD-10-CM | POA: Diagnosis not present

## 2013-04-09 ENCOUNTER — Encounter: Payer: Self-pay | Admitting: Critical Care Medicine

## 2013-04-24 ENCOUNTER — Ambulatory Visit: Payer: Self-pay | Admitting: Critical Care Medicine

## 2013-05-11 DIAGNOSIS — E785 Hyperlipidemia, unspecified: Secondary | ICD-10-CM | POA: Diagnosis not present

## 2013-05-11 DIAGNOSIS — E039 Hypothyroidism, unspecified: Secondary | ICD-10-CM | POA: Diagnosis not present

## 2013-05-11 DIAGNOSIS — J449 Chronic obstructive pulmonary disease, unspecified: Secondary | ICD-10-CM | POA: Diagnosis not present

## 2013-05-11 DIAGNOSIS — J4489 Other specified chronic obstructive pulmonary disease: Secondary | ICD-10-CM | POA: Diagnosis not present

## 2013-05-11 DIAGNOSIS — F209 Schizophrenia, unspecified: Secondary | ICD-10-CM | POA: Diagnosis not present

## 2013-05-11 DIAGNOSIS — Z79899 Other long term (current) drug therapy: Secondary | ICD-10-CM | POA: Diagnosis not present

## 2013-05-29 ENCOUNTER — Encounter: Payer: Self-pay | Admitting: Critical Care Medicine

## 2013-05-29 ENCOUNTER — Ambulatory Visit (INDEPENDENT_AMBULATORY_CARE_PROVIDER_SITE_OTHER): Payer: Self-pay | Admitting: Critical Care Medicine

## 2013-05-29 VITALS — BP 132/70 | HR 70 | Temp 98.9°F | Ht 68.5 in | Wt 318.0 lb

## 2013-05-29 DIAGNOSIS — J45909 Unspecified asthma, uncomplicated: Secondary | ICD-10-CM

## 2013-05-29 DIAGNOSIS — J4540 Moderate persistent asthma, uncomplicated: Secondary | ICD-10-CM

## 2013-05-29 MED ORDER — ALBUTEROL SULFATE HFA 108 (90 BASE) MCG/ACT IN AERS: 2.0000 | INHALATION_SPRAY | Freq: Four times a day (QID) | RESPIRATORY_TRACT | Status: DC | PRN

## 2013-05-29 MED ORDER — MOMETASONE FUROATE 220 MCG/INH IN AEPB: 2.0000 | INHALATION_SPRAY | Freq: Every day | RESPIRATORY_TRACT | Status: DC

## 2013-05-29 MED ORDER — ALBUTEROL SULFATE HFA 108 (90 BASE) MCG/ACT IN AERS: 2.0000 | INHALATION_SPRAY | RESPIRATORY_TRACT | Status: DC | PRN

## 2013-05-29 NOTE — Patient Instructions (Signed)
Stop spiriva Start Asmanex two puff daily Use albuterol as needed 2 puff every 4 hours for shortness of breath Stop 325mg  Aspirin Ok to take 81mg  aspirin daily Return 3 months

## 2013-05-29 NOTE — Progress Notes (Signed)
Subjective:    Patient ID: Dustin Barton, male    DOB: 01-20-1955, 58 y.o.   MRN: [id number]  HPI Comments: Dx osa, last ov dx COPD. No Rx as of yet. Not using inhalers as of yet. Pt more dyspneic over several years. Pt did lose weight, now increased.  On nasal oxygen QHS, not on cpap. DME: [facility]   05/29/2013 Chief Complaint  Patient presents with  . 3 month follow up    Breathing is unchanged - has SOB when walking or going up stairs, some chest tightness, and coughing with white phelgm.  Not much change with spiriva.  Still dyspneic with long distance or going up stairs.  Notes more coughing.  Still on oxygen at night.  Pt notes nose is narrowed.   PUL ASTHMA HISTORY 05/29/2013  Symptoms Daily  Nighttime awakenings 0-2/month  Interference with activity Minor limitations  SABA use 0-2 days/wk  Exacerbations requiring oral steroids 0-1 / year      Review of Systems  Constitutional: Negative for chills, diaphoresis, activity change, appetite change, fatigue and unexpected weight change.  HENT: Positive for postnasal drip, sinus pressure and voice change. Negative for congestion, dental problem, ear discharge, facial swelling, hearing loss, mouth sores, nosebleeds, sneezing, tinnitus and trouble swallowing.   Eyes: Negative for photophobia, discharge, itching and visual disturbance.  Respiratory: Positive for cough, choking and chest tightness. Negative for apnea and stridor.   Cardiovascular: Negative for palpitations.  Gastrointestinal: Negative for nausea, constipation, blood in stool and abdominal distention.  Genitourinary: Negative for dysuria, urgency, frequency, hematuria, flank pain, decreased urine volume and difficulty urinating.  Musculoskeletal: Negative for arthralgias, back pain, gait problem, joint swelling, myalgias and neck stiffness.  Skin: Negative for color change and pallor.  Neurological: Positive for dizziness, tremors and light-headedness. Negative  for seizures, syncope, speech difficulty, weakness and numbness.  Hematological: Negative for adenopathy. Does not bruise/bleed easily.  Psychiatric/Behavioral: Negative for confusion, sleep disturbance and agitation. The patient is not nervous/anxious.        Objective:   Physical Exam  Filed Vitals:   05/29/13 1020  BP: 132/70  Pulse: 70  Temp: 98.9 F (37.2 C)  TempSrc: Oral  Height: 5' 8.5" (1.74 m)  Weight: 318 lb (144.244 kg)  SpO2: 96%    Gen: Pleasant, obese, in no distress, anxious affect  ENT: No lesions,  mouth clear,  oropharynx clear, no postnasal drip  Neck: No JVD, no TMG, no carotid bruits  Lungs: No use of accessory muscles, no dullness to percussion, distant BS. No wheeze  Cardiovascular: RRR, heart sounds normal, no murmur or gallops, no peripheral edema  Abdomen: soft and NT, no HSM,  BS normal  Musculoskeletal: No deformities, no cyanosis or clubbing  Neuro: alert, non focal  Skin: Warm, no lesions or rashes  No results found.     Assessment & Plan:   Moderate persistent asthma Mod persis asthma Plan Stop spiriva Start Asmanex two puff daily Use albuterol as needed 2 puff every 4 hours for shortness of breath Stop 325mg  Aspirin Ok to take 81mg  aspirin daily Return 3 months     Updated Medication List Outpatient Encounter Prescriptions as of 05/29/2013  Medication Sig  . benztropine (COGENTIN) 2 MG tablet Take 1 tablet by mouth 2 (two) times daily.  . citalopram (CELEXA) 20 MG tablet Take 1 tablet by mouth daily.  . cyanocobalamin 2000 MCG tablet Take 2,000 mcg by mouth daily.  . divalproex (DEPAKOTE) 500 MG DR tablet  Take 2 tablets by mouth at bedtime.  . fenofibrate micronized (LOFIBRA) 134 MG capsule Take 1 capsule by mouth daily.  Marland Kitchen ibuprofen (ADVIL,MOTRIN) 200 MG tablet Take 200 mg by mouth every 6 (six) hours as needed.  Marland Kitchen levothyroxine (SYNTHROID, LEVOTHROID) 25 MCG tablet Take 1 tablet by mouth daily.  . Multiple Vitamin  (MULTIVITAMIN) tablet Take 1 tablet by mouth daily.  . Omega-3 Fatty Acids (FISH OIL) 1200 MG CAPS Take 1 capsule by mouth 2 (two) times daily.  Marland Kitchen omeprazole (PRILOSEC) 20 MG capsule Take 1 capsule by mouth daily.  . ziprasidone (GEODON) 40 MG capsule Take 1 capsule by mouth daily.  . ziprasidone (GEODON) 80 MG capsule Take 80 mg by mouth at bedtime.  . [DISCONTINUED] aspirin 325 MG tablet Take 325 mg by mouth 2 (two) times daily.  . [DISCONTINUED] tiotropium (SPIRIVA HANDIHALER) 18 MCG inhalation capsule Place 1 capsule (18 mcg total) into inhaler and inhale daily.  . [DISCONTINUED] tiotropium (SPIRIVA) 18 MCG inhalation capsule Place 1 capsule (18 mcg total) into inhaler and inhale daily.  Marland Kitchen albuterol (PROAIR HFA) 108 (90 BASE) MCG/ACT inhaler Inhale 2 puffs into the lungs every 4 (four) hours as needed for wheezing or shortness of breath.  Marland Kitchen albuterol (PROVENTIL HFA;VENTOLIN HFA) 108 (90 BASE) MCG/ACT inhaler Inhale 2 puffs into the lungs every 6 (six) hours as needed for wheezing or shortness of breath.  . mometasone (ASMANEX 120 METERED DOSES) 220 MCG/INH inhaler Inhale 2 puffs into the lungs daily.  . mometasone (ASMANEX 60 METERED DOSES) 220 MCG/INH inhaler Inhale 2 puffs into the lungs daily.

## 2013-05-30 NOTE — Assessment & Plan Note (Signed)
Mod persis asthma Plan Stop spiriva Start Asmanex two puff daily Use albuterol as needed 2 puff every 4 hours for shortness of breath Stop 325mg  Aspirin Ok to take 81mg  aspirin daily Return 3 months

## 2013-06-05 DIAGNOSIS — Z79899 Other long term (current) drug therapy: Secondary | ICD-10-CM | POA: Diagnosis not present

## 2013-06-05 DIAGNOSIS — K219 Gastro-esophageal reflux disease without esophagitis: Secondary | ICD-10-CM | POA: Diagnosis not present

## 2013-06-05 DIAGNOSIS — R109 Unspecified abdominal pain: Secondary | ICD-10-CM | POA: Diagnosis not present

## 2013-06-21 DIAGNOSIS — R1032 Left lower quadrant pain: Secondary | ICD-10-CM | POA: Diagnosis not present

## 2013-06-21 DIAGNOSIS — K219 Gastro-esophageal reflux disease without esophagitis: Secondary | ICD-10-CM | POA: Diagnosis not present

## 2013-06-21 DIAGNOSIS — K2270 Barrett's esophagus without dysplasia: Secondary | ICD-10-CM | POA: Diagnosis not present

## 2013-06-21 DIAGNOSIS — K5730 Diverticulosis of large intestine without perforation or abscess without bleeding: Secondary | ICD-10-CM | POA: Diagnosis not present

## 2013-06-22 DIAGNOSIS — F259 Schizoaffective disorder, unspecified: Secondary | ICD-10-CM | POA: Diagnosis not present

## 2013-06-27 DIAGNOSIS — F259 Schizoaffective disorder, unspecified: Secondary | ICD-10-CM | POA: Diagnosis not present

## 2013-07-04 DIAGNOSIS — F259 Schizoaffective disorder, unspecified: Secondary | ICD-10-CM | POA: Diagnosis not present

## 2013-07-12 DIAGNOSIS — K219 Gastro-esophageal reflux disease without esophagitis: Secondary | ICD-10-CM | POA: Diagnosis not present

## 2013-07-12 DIAGNOSIS — K2270 Barrett's esophagus without dysplasia: Secondary | ICD-10-CM | POA: Diagnosis not present

## 2013-07-12 DIAGNOSIS — K209 Esophagitis, unspecified without bleeding: Secondary | ICD-10-CM | POA: Diagnosis not present

## 2013-07-25 DIAGNOSIS — F259 Schizoaffective disorder, unspecified: Secondary | ICD-10-CM | POA: Diagnosis not present

## 2013-08-07 ENCOUNTER — Encounter: Payer: Self-pay | Admitting: Critical Care Medicine

## 2013-10-02 ENCOUNTER — Ambulatory Visit (INDEPENDENT_AMBULATORY_CARE_PROVIDER_SITE_OTHER): Payer: Self-pay | Admitting: Critical Care Medicine

## 2013-10-02 ENCOUNTER — Encounter: Payer: Self-pay | Admitting: Critical Care Medicine

## 2013-10-02 VITALS — BP 124/72 | HR 81 | Temp 98.0°F | Ht 66.0 in | Wt 317.6 lb

## 2013-10-02 DIAGNOSIS — J45901 Unspecified asthma with (acute) exacerbation: Secondary | ICD-10-CM | POA: Diagnosis not present

## 2013-10-02 DIAGNOSIS — J4541 Moderate persistent asthma with (acute) exacerbation: Secondary | ICD-10-CM

## 2013-10-02 MED ORDER — FLUTICASONE FUROATE-VILANTEROL 200-25 MCG/INH IN AEPB: 1.0000 | INHALATION_SPRAY | Freq: Every day | RESPIRATORY_TRACT | Status: DC

## 2013-10-02 NOTE — Progress Notes (Signed)
Subjective:    Patient ID: Dustin Barton, male    DOB: February 08, 1955, 58 y.o.   MRN: [id number]  HPI 10/02/2013 Chief Complaint  Patient presents with  . Follow-up    SOB with exertion.  Cough with white mucus.  Occas wheezing.   Pt using proair q4h. On asmanex. Not using oxygen.  Still with doe. No chest pain. No f/c/s.   Pt denies any significant sore throat, nasal congestion or excess secretions, fever, chills, sweats, unintended weight loss, pleurtic or exertional chest pain, orthopnea PND, or leg swelling Pt denies any increase in rescue therapy over baseline, denies waking up needing it or having any early am or nocturnal exacerbations of coughing/wheezing/or dyspnea. Pt also denies any obvious fluctuation in symptoms with  weather or environmental change or other alleviating or aggravating factors     Review of Systems Constitutional:   No  weight loss, night sweats,  Fevers, chills, fatigue, lassitude. HEENT:   No headaches,  Difficulty swallowing,  Tooth/dental problems,  Sore throat,                No sneezing, itching, ear ache, nasal congestion, post nasal drip,   CV:  No chest pain,  Orthopnea, PND, swelling in lower extremities, anasarca, dizziness, palpitations  GI  No heartburn, indigestion, abdominal pain, nausea, vomiting, diarrhea, change in bowel habits, loss of appetite  Resp: Notes  shortness of breath with exertion not  at rest.  No excess mucus, no productive cough,  No non-productive cough,  No coughing up of blood.  No change in color of mucus.  No wheezing.  No chest wall deformity  Skin: no rash or lesions.  GU: no dysuria, change in color of urine, no urgency or frequency.  No flank pain.  MS:  No joint pain or swelling.  No decreased range of motion.  No back pain.  Psych:  No change in mood or affect. No depression or anxiety.  No memory loss.     Objective:   Physical Exam  Filed Vitals:   10/02/13 1047  BP: 124/72  Pulse: 81  Temp: 98 F  (36.7 C)  TempSrc: Oral  Height: 5\' 6"  (1.676 m)  Weight: 317 lb 9.6 oz (144.062 kg)  SpO2: 96%    Gen: Pleasant, obese, in no distress,  normal affect  ENT: No lesions,  mouth clear,  oropharynx clear, no postnasal drip  Neck: No JVD, no TMG, no carotid bruits  Lungs: No use of accessory muscles, no dullness to percussion, clear without rales or rhonchi  Cardiovascular: RRR, heart sounds normal, no murmur or gallops, no peripheral edema  Abdomen: soft and NT, no HSM,  BS normal  Musculoskeletal: No deformities, no cyanosis or clubbing  Neuro: alert, non focal  Skin: Warm, no lesions or rashes  No results found.       Assessment & Plan:   Moderate persistent asthma Moderate persistent asthma, failed asmanex Plan Stop Asmanex Start Breo 200 1 puff once daily Use proair as needed only Get Flu Vaccine at [place] Follow up with Dr. Joya Gaskins in 3 months    Updated Medication List Outpatient Encounter Prescriptions as of 10/02/2013  Medication Sig  . albuterol (PROAIR HFA) 108 (90 BASE) MCG/ACT inhaler Inhale 2 puffs into the lungs every 4 (four) hours as needed for wheezing or shortness of breath.  Marland Kitchen aspirin 81 MG tablet Take 81 mg by mouth daily.  . benztropine (COGENTIN) 2 MG tablet Take 1 tablet by mouth  2 (two) times daily.  . citalopram (CELEXA) 20 MG tablet Take 1 tablet by mouth daily.  . cyanocobalamin 2000 MCG tablet Take 2,000 mcg by mouth daily.  . divalproex (DEPAKOTE) 500 MG DR tablet Take 2 tablets by mouth at bedtime.  . fenofibrate micronized (LOFIBRA) 134 MG capsule Take 1 capsule by mouth daily.  Marland Kitchen ibuprofen (ADVIL,MOTRIN) 200 MG tablet Take 200 mg by mouth every 6 (six) hours as needed.  Marland Kitchen levothyroxine (SYNTHROID, LEVOTHROID) 25 MCG tablet Take 1 tablet by mouth daily.  . Multiple Vitamin (MULTIVITAMIN) tablet Take 1 tablet by mouth daily.  . Omega-3 Fatty Acids (FISH OIL) 1200 MG CAPS Take 1 capsule by mouth 2 (two) times daily.  Marland Kitchen  omeprazole (PRILOSEC) 20 MG capsule Take 1 capsule by mouth daily.  . ziprasidone (GEODON) 40 MG capsule Take 1 capsule by mouth daily.  . ziprasidone (GEODON) 80 MG capsule Take 80 mg by mouth at bedtime.  . [DISCONTINUED] albuterol (PROVENTIL HFA;VENTOLIN HFA) 108 (90 BASE) MCG/ACT inhaler Inhale 2 puffs into the lungs every 6 (six) hours as needed for wheezing or shortness of breath.  . Fluticasone Furoate-Vilanterol (BREO ELLIPTA) 200-25 MCG/INH AEPB Inhale 1 puff into the lungs daily.  . [DISCONTINUED] mometasone (ASMANEX 120 METERED DOSES) 220 MCG/INH inhaler Inhale 2 puffs into the lungs daily.  . [DISCONTINUED] mometasone (ASMANEX 60 METERED DOSES) 220 MCG/INH inhaler Inhale 2 puffs into the lungs daily.

## 2013-10-02 NOTE — Patient Instructions (Signed)
Stop Asmanex Start Breo 200 1 puff once daily Use proair as needed only Get Flu Vaccine at [place] Follow up with Dr. Joya Gaskins in 3 months

## 2013-10-02 NOTE — Assessment & Plan Note (Signed)
Moderate persistent asthma, failed asmanex Plan Stop Asmanex Start Breo 200 1 puff once daily Use proair as needed only Get Flu Vaccine at [place] Follow up with Dr. Joya Gaskins in 3 months

## 2013-10-08 DIAGNOSIS — H2589 Other age-related cataract: Secondary | ICD-10-CM | POA: Diagnosis not present

## 2013-10-12 DIAGNOSIS — F250 Schizoaffective disorder, bipolar type: Secondary | ICD-10-CM | POA: Diagnosis not present

## 2013-10-16 ENCOUNTER — Telehealth: Payer: Self-pay | Admitting: Critical Care Medicine

## 2013-10-16 ENCOUNTER — Encounter: Payer: Self-pay | Admitting: Critical Care Medicine

## 2013-10-16 ENCOUNTER — Ambulatory Visit (INDEPENDENT_AMBULATORY_CARE_PROVIDER_SITE_OTHER): Payer: Self-pay | Admitting: Critical Care Medicine

## 2013-10-16 VITALS — BP 134/78 | HR 77 | Temp 98.2°F | Ht 69.0 in | Wt 315.6 lb

## 2013-10-16 DIAGNOSIS — J4541 Moderate persistent asthma with (acute) exacerbation: Secondary | ICD-10-CM

## 2013-10-16 DIAGNOSIS — J45901 Unspecified asthma with (acute) exacerbation: Secondary | ICD-10-CM

## 2013-10-16 MED ORDER — BUDESONIDE-FORMOTEROL FUMARATE 160-4.5 MCG/ACT IN AERO: 2.0000 | INHALATION_SPRAY | Freq: Two times a day (BID) | RESPIRATORY_TRACT | Status: DC

## 2013-10-16 MED ORDER — METHYLPREDNISOLONE ACETATE 80 MG/ML IJ SUSP
120.0000 mg | Freq: Once | INTRAMUSCULAR | Status: AC
  Administered 2013-10-16: 120 mg via INTRAMUSCULAR

## 2013-10-16 NOTE — Assessment & Plan Note (Signed)
Moderate persistent asthma with acute exacerbation Inability to afford breo Plan A depomedrol injection was given 120mg   Stop breo Start symbicort 160 two puff twice daily  Return 1 month

## 2013-10-16 NOTE — Patient Instructions (Addendum)
A depomedrol injection was given 120mg   Stop breo Start symbicort 160 two puff twice daily  Return 1 month

## 2013-10-16 NOTE — Telephone Encounter (Signed)
No message needed.  appt today

## 2013-10-16 NOTE — Progress Notes (Signed)
Subjective:    Patient ID: Dustin Barton, male    DOB: Mar 02, 1955, 58 y.o.   MRN: [id number]  HPI  10/16/2013 Chief Complaint  Patient presents with  . Acute Visit    c/o 2 days ago was going into gas station rainy,"lost breath",did not get Breo at pharmacy "too expensive",wheezing,cough occass.-white,getting dental work,chest tightness,no fcs  Pt cannot afford inhalers.  Pt with more dyspnea and cough with weather changes Pt had dental work recently.  Has proair uses as needed   PUL ASTHMA HISTORY 10/16/2013 10/02/2013 05/29/2013  Symptoms Throughout the day Daily Daily  Nighttime awakenings 3-4/month 3-4/month 0-2/month  Interference with activity Some limitations Some limitations Minor limitations  SABA use Several times/day Several times/day 0-2 days/wk  Exacerbations requiring oral steroids 0-1 / year 0-1 / year 0-1 / year     Review of Systems  Constitutional:   No  weight loss, night sweats,  Fevers, chills, fatigue, lassitude. HEENT:   No headaches,  Difficulty swallowing,  Tooth/dental problems,  Sore throat,                No sneezing, itching, ear ache, nasal congestion, post nasal drip,   CV:  No chest pain,  Orthopnea, PND, swelling in lower extremities, anasarca, dizziness, palpitations  GI  No heartburn, indigestion, abdominal pain, nausea, vomiting, diarrhea, change in bowel habits, loss of appetite  Resp: Notes  shortness of breath with exertion not  at rest.  No excess mucus, no productive cough,  No non-productive cough,  No coughing up of blood.  No change in color of mucus.  No wheezing.  No chest wall deformity  Skin: no rash or lesions.  GU: no dysuria, change in color of urine, no urgency or frequency.  No flank pain.  MS:  No joint pain or swelling.  No decreased range of motion.  No back pain.  Psych:  No change in mood or affect. No depression or anxiety.  No memory loss.     Objective:   Physical Exam   Filed Vitals:   10/16/13 1021  BP:  134/78  Pulse: 77  Temp: 98.2 F (36.8 C)  TempSrc: Oral  Height: 5\' 9"  (1.753 m)  Weight: 315 lb 9.6 oz (143.155 kg)  SpO2: 94%    Gen: Pleasant, obese, in no distress,  normal affect  ENT: No lesions,  mouth clear,  oropharynx clear, no postnasal drip  Neck: No JVD, no TMG, no carotid bruits  Lungs: No use of accessory muscles, no dullness to percussion, clear without rales or rhonchi  Cardiovascular: RRR, heart sounds normal, no murmur or gallops, no peripheral edema  Abdomen: soft and NT, no HSM,  BS normal  Musculoskeletal: No deformities, no cyanosis or clubbing  Neuro: alert, non focal  Skin: Warm, no lesions or rashes  No results found.       Assessment & Plan:   Moderate persistent asthma with acute exacerbation Moderate persistent asthma with acute exacerbation Inability to afford breo Plan A depomedrol injection was given 120mg   Stop breo Start symbicort 160 two puff twice daily  Return 1 month     Updated Medication List Outpatient Encounter Prescriptions as of 10/16/2013  Medication Sig  . albuterol (PROAIR HFA) 108 (90 BASE) MCG/ACT inhaler Inhale 2 puffs into the lungs every 4 (four) hours as needed for wheezing or shortness of breath.  Marland Kitchen aluminum & magnesium hydroxide-simethicone (MYLANTA) 500-450-40 MG/5ML suspension Take 10 mLs by mouth every 6 (six) hours as needed  for indigestion.  Marland Kitchen aspirin 81 MG tablet Take 81 mg by mouth daily.  . benztropine (COGENTIN) 2 MG tablet Take 1 tablet by mouth 2 (two) times daily.  . citalopram (CELEXA) 20 MG tablet Take 1 tablet by mouth daily.  . cyanocobalamin 2000 MCG tablet Take 2,000 mcg by mouth daily.  . divalproex (DEPAKOTE) 500 MG DR tablet Take 2 tablets by mouth at bedtime.  . fenofibrate micronized (LOFIBRA) 134 MG capsule Take 1 capsule by mouth daily.  Marland Kitchen ibuprofen (ADVIL,MOTRIN) 200 MG tablet Take 200 mg by mouth every 6 (six) hours as needed.  Marland Kitchen levothyroxine (SYNTHROID, LEVOTHROID) 25 MCG  tablet Take 1 tablet by mouth daily.  . Multiple Vitamin (MULTIVITAMIN) tablet Take 1 tablet by mouth daily.  . Omega-3 Fatty Acids (FISH OIL) 1200 MG CAPS Take 1 capsule by mouth 2 (two) times daily.  Marland Kitchen omeprazole (PRILOSEC) 20 MG capsule Take 1 capsule by mouth daily.  . psyllium (METAMUCIL) 58.6 % powder Take 1 packet by mouth.  . ziprasidone (GEODON) 40 MG capsule Take 1 capsule by mouth. Take 1 tablet at bedtime. Take 1 tablet daily as needed.  . budesonide-formoterol (SYMBICORT) 160-4.5 MCG/ACT inhaler Inhale 2 puffs into the lungs 2 (two) times daily.  . budesonide-formoterol (SYMBICORT) 160-4.5 MCG/ACT inhaler Inhale 2 puffs into the lungs 2 (two) times daily.  . [DISCONTINUED] Fluticasone Furoate-Vilanterol (BREO ELLIPTA) 200-25 MCG/INH AEPB Inhale 1 puff into the lungs daily.  . [DISCONTINUED] ziprasidone (GEODON) 80 MG capsule Take 80 mg by mouth at bedtime.  . [EXPIRED] methylPREDNISolone acetate (DEPO-MEDROL) injection 120 mg

## 2013-10-17 DIAGNOSIS — Z79899 Other long term (current) drug therapy: Secondary | ICD-10-CM | POA: Diagnosis not present

## 2013-10-17 DIAGNOSIS — E039 Hypothyroidism, unspecified: Secondary | ICD-10-CM | POA: Diagnosis not present

## 2013-10-17 DIAGNOSIS — J449 Chronic obstructive pulmonary disease, unspecified: Secondary | ICD-10-CM | POA: Diagnosis not present

## 2013-10-17 DIAGNOSIS — F209 Schizophrenia, unspecified: Secondary | ICD-10-CM | POA: Diagnosis not present

## 2013-10-17 DIAGNOSIS — M1990 Unspecified osteoarthritis, unspecified site: Secondary | ICD-10-CM | POA: Diagnosis not present

## 2013-10-17 DIAGNOSIS — Z23 Encounter for immunization: Secondary | ICD-10-CM | POA: Diagnosis not present

## 2013-10-30 DIAGNOSIS — R21 Rash and other nonspecific skin eruption: Secondary | ICD-10-CM | POA: Diagnosis not present

## 2013-11-02 DIAGNOSIS — R21 Rash and other nonspecific skin eruption: Secondary | ICD-10-CM | POA: Diagnosis not present

## 2013-11-12 DIAGNOSIS — I8310 Varicose veins of unspecified lower extremity with inflammation: Secondary | ICD-10-CM | POA: Diagnosis not present

## 2013-11-12 DIAGNOSIS — L853 Xerosis cutis: Secondary | ICD-10-CM | POA: Diagnosis not present

## 2013-11-12 DIAGNOSIS — R600 Localized edema: Secondary | ICD-10-CM | POA: Diagnosis not present

## 2013-11-20 ENCOUNTER — Encounter: Payer: Self-pay | Admitting: Critical Care Medicine

## 2013-11-20 ENCOUNTER — Ambulatory Visit (INDEPENDENT_AMBULATORY_CARE_PROVIDER_SITE_OTHER): Payer: Medicaid Other | Admitting: Critical Care Medicine

## 2013-11-20 VITALS — BP 104/64 | HR 71 | Temp 96.9°F | Ht 69.0 in | Wt 318.0 lb

## 2013-11-20 DIAGNOSIS — J4541 Moderate persistent asthma with (acute) exacerbation: Secondary | ICD-10-CM

## 2013-11-20 DIAGNOSIS — J45901 Unspecified asthma with (acute) exacerbation: Secondary | ICD-10-CM

## 2013-11-20 MED ORDER — BUDESONIDE-FORMOTEROL FUMARATE 160-4.5 MCG/ACT IN AERO: 2.0000 | INHALATION_SPRAY | Freq: Two times a day (BID) | RESPIRATORY_TRACT | Status: DC

## 2013-11-20 MED ORDER — ALBUTEROL SULFATE HFA 108 (90 BASE) MCG/ACT IN AERS: 2.0000 | INHALATION_SPRAY | RESPIRATORY_TRACT | Status: AC | PRN

## 2013-11-20 NOTE — Progress Notes (Signed)
Subjective:    Patient ID: Dustin Barton, male    DOB: 01-09-1955, 58 y.o.   MRN: [id number]  HPI  11/20/2013 Chief Complaint  Patient presents with  . Follow-up    Feeling better, sinus pr. x 1 wk.,pnd,sorethroat occass.,sob same, wheezing occass.,no cough usually   At last ov we gave depo injection. Changed to symbicort, could not afford Breo. Recent dental work, had to drill holes.  Using ibuprofen for pain.  Using albuterol about 3-4 x per day.   Min cough, min dyspnea.  Pt denies any significant sore throat, nasal congestion or excess secretions, fever, chills, sweats, unintended weight loss, pleurtic or exertional chest pain, orthopnea PND, or leg swelling Pt denies any increase in rescue therapy over baseline, denies waking up needing it or having any early am or nocturnal exacerbations of coughing/wheezing/or dyspnea. Pt also denies any obvious fluctuation in symptoms with  weather or environmental change or other alleviating or aggravating factors   PUL ASTHMA HISTORY 11/20/2013 10/16/2013 10/02/2013 05/29/2013  Symptoms Daily Throughout the day Daily Daily  Nighttime awakenings 0-2/month 3-4/month 3-4/month 0-2/month  Interference with activity Minor limitations Some limitations Some limitations Minor limitations  SABA use Several times/day Several times/day Several times/day 0-2 days/wk  Exacerbations requiring oral steroids 0-1 / year 0-1 / year 0-1 / year 0-1 / year     Review of Systems Constitutional:   No  weight loss, night sweats,  Fevers, chills, fatigue, lassitude. HEENT:   No headaches,  Difficulty swallowing,  Tooth/dental problems,  Sore throat,                No sneezing, itching, ear ache, nasal congestion, post nasal drip,   CV:  No chest pain,  Orthopnea, PND, swelling in lower extremities, anasarca, dizziness, palpitations  GI  No heartburn, indigestion, abdominal pain, nausea, vomiting, diarrhea, change in bowel habits, loss of appetite  Resp: Notes   shortness of breath with exertion not  at rest.  No excess mucus, no productive cough,  No non-productive cough,  No coughing up of blood.  No change in color of mucus.  No wheezing.  No chest wall deformity  Skin: no rash or lesions.  GU: no dysuria, change in color of urine, no urgency or frequency.  No flank pain.  MS:  No joint pain or swelling.  No decreased range of motion.  No back pain.  Psych:  No change in mood or affect. No depression or anxiety.  No memory loss.     Objective:   Physical Exam  Filed Vitals:   11/20/13 0913  BP: 104/64  Pulse: 71  Temp: 96.9 F (36.1 C)  TempSrc: Oral  Height: 5\' 9"  (1.753 m)  Weight: 318 lb (144.244 kg)  SpO2: 95%    Gen: Pleasant, obese, in no distress,  normal affect  ENT: No lesions,  mouth clear,  oropharynx clear, no postnasal drip  Neck: No JVD, no TMG, no carotid bruits  Lungs: No use of accessory muscles, no dullness to percussion, clear without rales or rhonchi  Cardiovascular: RRR, heart sounds normal, no murmur or gallops, no peripheral edema  Abdomen: soft and NT, no HSM,  BS normal  Musculoskeletal: No deformities, no cyanosis or clubbing  Neuro: alert, non focal  Skin: Warm, no lesions or rashes  No results found.       Assessment & Plan:   Moderate persistent asthma with acute exacerbation Stable moderate persistent asthma Plan No change in inhaled or maintenance medications.  Return in  4 months    Updated Medication List Outpatient Encounter Prescriptions as of 11/20/2013  Medication Sig  . albuterol (PROAIR HFA) 108 (90 BASE) MCG/ACT inhaler Inhale 2 puffs into the lungs every 4 (four) hours as needed for wheezing or shortness of breath.  Marland Kitchen aluminum & magnesium hydroxide-simethicone (MYLANTA) 500-450-40 MG/5ML suspension Take 10 mLs by mouth every 6 (six) hours as needed for indigestion.  Marland Kitchen aspirin 81 MG tablet Take 81 mg by mouth daily.  . benztropine (COGENTIN) 2 MG tablet Take 1 tablet  by mouth 2 (two) times daily.  . budesonide-formoterol (SYMBICORT) 160-4.5 MCG/ACT inhaler Inhale 2 puffs into the lungs 2 (two) times daily.  . citalopram (CELEXA) 20 MG tablet Take 1 tablet by mouth daily.  . cyanocobalamin 2000 MCG tablet Take 2,000 mcg by mouth daily.  . divalproex (DEPAKOTE) 500 MG DR tablet Take 2 tablets by mouth at bedtime.  . fenofibrate micronized (LOFIBRA) 134 MG capsule Take 1 capsule by mouth daily.  Marland Kitchen ibuprofen (ADVIL,MOTRIN) 200 MG tablet Take 200 mg by mouth every 6 (six) hours as needed.  Marland Kitchen levothyroxine (SYNTHROID, LEVOTHROID) 25 MCG tablet Take 1 tablet by mouth daily.  . magnesium 30 MG tablet Take 30 mg by mouth. Daily by mouth.  . Multiple Vitamin (MULTIVITAMIN) tablet Take 1 tablet by mouth daily.  . Omega-3 Fatty Acids (FISH OIL) 1200 MG CAPS Take 1 capsule by mouth 2 (two) times daily.  Marland Kitchen omeprazole (PRILOSEC) 20 MG capsule Take 1 capsule by mouth daily.  . psyllium (METAMUCIL) 58.6 % powder Take 1 packet by mouth.  . ziprasidone (GEODON) 40 MG capsule Take 1 capsule by mouth. Take 1 tablet at bedtime. Take 1 tablet daily as needed.  . [DISCONTINUED] albuterol (PROAIR HFA) 108 (90 BASE) MCG/ACT inhaler Inhale 2 puffs into the lungs every 4 (four) hours as needed for wheezing or shortness of breath.  . [DISCONTINUED] budesonide-formoterol (SYMBICORT) 160-4.5 MCG/ACT inhaler Inhale 2 puffs into the lungs 2 (two) times daily.  . [DISCONTINUED] budesonide-formoterol (SYMBICORT) 160-4.5 MCG/ACT inhaler Inhale 2 puffs into the lungs 2 (two) times daily.

## 2013-11-20 NOTE — Patient Instructions (Signed)
Stay on symbicort two puff twice daily Use proair as needed Stop asmanex Return 4 months I recommend you see an oral surgeon for your bottom front teeth

## 2013-11-20 NOTE — Assessment & Plan Note (Signed)
Stable moderate persistent asthma Plan No change in inhaled or maintenance medications. Return in  4 months 

## 2013-11-21 DIAGNOSIS — R252 Cramp and spasm: Secondary | ICD-10-CM | POA: Diagnosis not present

## 2013-12-04 DIAGNOSIS — R600 Localized edema: Secondary | ICD-10-CM | POA: Diagnosis not present

## 2013-12-04 DIAGNOSIS — I8310 Varicose veins of unspecified lower extremity with inflammation: Secondary | ICD-10-CM | POA: Diagnosis not present

## 2013-12-08 DIAGNOSIS — Z7982 Long term (current) use of aspirin: Secondary | ICD-10-CM | POA: Diagnosis not present

## 2013-12-08 DIAGNOSIS — X58XXXA Exposure to other specified factors, initial encounter: Secondary | ICD-10-CM | POA: Diagnosis not present

## 2013-12-08 DIAGNOSIS — E039 Hypothyroidism, unspecified: Secondary | ICD-10-CM | POA: Diagnosis not present

## 2013-12-08 DIAGNOSIS — E780 Pure hypercholesterolemia: Secondary | ICD-10-CM | POA: Diagnosis not present

## 2013-12-08 DIAGNOSIS — I10 Essential (primary) hypertension: Secondary | ICD-10-CM | POA: Diagnosis not present

## 2013-12-08 DIAGNOSIS — S0501XA Injury of conjunctiva and corneal abrasion without foreign body, right eye, initial encounter: Secondary | ICD-10-CM | POA: Diagnosis not present

## 2013-12-08 DIAGNOSIS — Z87891 Personal history of nicotine dependence: Secondary | ICD-10-CM | POA: Diagnosis not present

## 2013-12-13 DIAGNOSIS — S0501XA Injury of conjunctiva and corneal abrasion without foreign body, right eye, initial encounter: Secondary | ICD-10-CM | POA: Diagnosis not present

## 2014-01-09 DIAGNOSIS — F250 Schizoaffective disorder, bipolar type: Secondary | ICD-10-CM | POA: Diagnosis not present

## 2014-01-15 DIAGNOSIS — J309 Allergic rhinitis, unspecified: Secondary | ICD-10-CM | POA: Diagnosis not present

## 2014-01-15 DIAGNOSIS — R1031 Right lower quadrant pain: Secondary | ICD-10-CM | POA: Diagnosis not present

## 2014-01-15 DIAGNOSIS — K4090 Unilateral inguinal hernia, without obstruction or gangrene, not specified as recurrent: Secondary | ICD-10-CM | POA: Diagnosis not present

## 2014-01-15 DIAGNOSIS — R109 Unspecified abdominal pain: Secondary | ICD-10-CM | POA: Diagnosis not present

## 2014-01-22 DIAGNOSIS — K4090 Unilateral inguinal hernia, without obstruction or gangrene, not specified as recurrent: Secondary | ICD-10-CM | POA: Diagnosis not present

## 2014-01-31 DIAGNOSIS — R1011 Right upper quadrant pain: Secondary | ICD-10-CM | POA: Diagnosis not present

## 2014-01-31 DIAGNOSIS — R1031 Right lower quadrant pain: Secondary | ICD-10-CM | POA: Diagnosis not present

## 2014-01-31 DIAGNOSIS — F3160 Bipolar disorder, current episode mixed, unspecified: Secondary | ICD-10-CM | POA: Diagnosis not present

## 2014-02-04 DIAGNOSIS — R1011 Right upper quadrant pain: Secondary | ICD-10-CM | POA: Diagnosis not present

## 2014-02-04 DIAGNOSIS — R1031 Right lower quadrant pain: Secondary | ICD-10-CM | POA: Diagnosis not present

## 2014-02-05 DIAGNOSIS — I8310 Varicose veins of unspecified lower extremity with inflammation: Secondary | ICD-10-CM | POA: Diagnosis not present

## 2014-02-14 DIAGNOSIS — R1031 Right lower quadrant pain: Secondary | ICD-10-CM | POA: Diagnosis not present

## 2014-02-14 DIAGNOSIS — F3160 Bipolar disorder, current episode mixed, unspecified: Secondary | ICD-10-CM | POA: Diagnosis not present

## 2014-02-14 DIAGNOSIS — F259 Schizoaffective disorder, unspecified: Secondary | ICD-10-CM | POA: Diagnosis not present

## 2014-02-19 ENCOUNTER — Ambulatory Visit: Payer: Medicaid Other | Admitting: Critical Care Medicine

## 2014-02-19 DIAGNOSIS — F209 Schizophrenia, unspecified: Secondary | ICD-10-CM | POA: Diagnosis not present

## 2014-02-19 DIAGNOSIS — M1990 Unspecified osteoarthritis, unspecified site: Secondary | ICD-10-CM | POA: Diagnosis not present

## 2014-02-19 DIAGNOSIS — E039 Hypothyroidism, unspecified: Secondary | ICD-10-CM | POA: Diagnosis not present

## 2014-02-19 DIAGNOSIS — Z6841 Body Mass Index (BMI) 40.0 and over, adult: Secondary | ICD-10-CM | POA: Diagnosis not present

## 2014-02-19 DIAGNOSIS — J449 Chronic obstructive pulmonary disease, unspecified: Secondary | ICD-10-CM | POA: Diagnosis not present

## 2014-02-19 DIAGNOSIS — Z79899 Other long term (current) drug therapy: Secondary | ICD-10-CM | POA: Diagnosis not present

## 2014-02-19 DIAGNOSIS — Z1389 Encounter for screening for other disorder: Secondary | ICD-10-CM | POA: Diagnosis not present

## 2014-02-26 ENCOUNTER — Ambulatory Visit: Payer: Medicaid Other | Admitting: Critical Care Medicine

## 2014-04-03 DIAGNOSIS — F250 Schizoaffective disorder, bipolar type: Secondary | ICD-10-CM | POA: Diagnosis not present

## 2014-04-16 ENCOUNTER — Encounter: Payer: Self-pay | Admitting: Critical Care Medicine

## 2014-04-16 ENCOUNTER — Ambulatory Visit (INDEPENDENT_AMBULATORY_CARE_PROVIDER_SITE_OTHER): Payer: Medicaid Other | Admitting: Critical Care Medicine

## 2014-04-16 VITALS — BP 112/78 | HR 73 | Temp 97.2°F | Ht 69.0 in | Wt 310.2 lb

## 2014-04-16 DIAGNOSIS — Z23 Encounter for immunization: Secondary | ICD-10-CM | POA: Diagnosis not present

## 2014-04-16 DIAGNOSIS — J4540 Moderate persistent asthma, uncomplicated: Secondary | ICD-10-CM

## 2014-04-16 DIAGNOSIS — J4541 Moderate persistent asthma with (acute) exacerbation: Secondary | ICD-10-CM

## 2014-04-16 NOTE — Patient Instructions (Signed)
Pneumovax will be given Stay on symbicort two puff twice daily Follow up in 3 months and will give Prevnar 13

## 2014-04-16 NOTE — Progress Notes (Signed)
Subjective:    Patient ID: Dustin Barton, male    DOB: 1955-10-23, 59 y.o.   MRN: [id number]  HPI   04/16/2014 Chief Complaint  Patient presents with  . Follow-up    Using CPAP at night avg. 7 hrs. each night,having PND,no cough,sob with exertion,esp. when using stairs to get to apartment,occass. wheezing,midchest tightness, no fcs  Pt notes some pndrip.  Now has dentures, fitting issue.   No draining issues.  Pt has top dentures.  Notes some DOE up stairs.  No real cough.  Notes some qhs dyspnea. Pt denies any significant sore throat, nasal congestion or excess secretions, fever, chills, sweats, unintended weight loss, pleurtic or exertional chest pain, orthopnea PND, or leg swelling Pt denies any increase in rescue therapy over baseline, denies waking up needing it or having any early am or nocturnal exacerbations of coughing/wheezing/or dyspnea. Pt also denies any obvious fluctuation in symptoms with  weather or environmental change or other alleviating or aggravating factors    PUL ASTHMA HISTORY 04/16/2014 11/20/2013 10/16/2013 10/02/2013 05/29/2013  Symptoms >2 days/week Daily Throughout the day Daily Daily  Nighttime awakenings 0-2/month 0-2/month 3-4/month 3-4/month 0-2/month  Interference with activity Some limitations Minor limitations Some limitations Some limitations Minor limitations  SABA use Several times/day Several times/day Several times/day Several times/day 0-2 days/wk  Exacerbations requiring oral steroids - 0-1 / year 0-1 / year 0-1 / year 0-1 / year     Review of Systems Constitutional:   No  weight loss, night sweats,  Fevers, chills, fatigue, lassitude. HEENT:   No headaches,  Difficulty swallowing,  Tooth/dental problems,  Sore throat,                No sneezing, itching, ear ache, nasal congestion, post nasal drip,   CV:  No chest pain,  Orthopnea, PND, swelling in lower extremities, anasarca, dizziness, palpitations  GI  No heartburn, indigestion,  abdominal pain, nausea, vomiting, diarrhea, change in bowel habits, loss of appetite  Resp: Notes  shortness of breath with exertion not  at rest.  No excess mucus, no productive cough,  No non-productive cough,  No coughing up of blood.  No change in color of mucus.  No wheezing.  No chest wall deformity  Skin: no rash or lesions.  GU: no dysuria, change in color of urine, no urgency or frequency.  No flank pain.  MS:  No joint pain or swelling.  No decreased range of motion.  No back pain.  Psych:  No change in mood or affect. No depression or anxiety.  No memory loss.     Objective:   Physical Exam  Filed Vitals:   04/16/14 1016  BP: 112/78  Pulse: 73  Temp: 97.2 F (36.2 C)  TempSrc: Oral  Height: 5\' 9"  (1.753 m)  Weight: 310 lb 3.2 oz (140.706 kg)  SpO2: 93%    Gen: Pleasant, obese, in no distress,  normal affect  ENT: No lesions,  mouth clear,  oropharynx clear, no postnasal drip  Neck: No JVD, no TMG, no carotid bruits  Lungs: No use of accessory muscles, no dullness to percussion, clear without rales or rhonchi  Cardiovascular: RRR, heart sounds normal, no murmur or gallops, no peripheral edema  Abdomen: soft and NT, no HSM,  BS normal  Musculoskeletal: No deformities, no cyanosis or clubbing  Neuro: alert, non focal  Skin: Warm, no lesions or rashes  No results found.       Assessment & Plan:   Moderate  persistent asthma with acute exacerbation Moderate persistent asthma stable at present Plan Pneumovax will be given Stay on symbicort two puff twice daily Follow up in 3 months and will give Prevnar 13       Updated Medication List Outpatient Encounter Prescriptions as of 04/16/2014  Medication Sig  . albuterol (PROAIR HFA) 108 (90 BASE) MCG/ACT inhaler Inhale 2 puffs into the lungs every 4 (four) hours as needed for wheezing or shortness of breath.  Marland Kitchen aluminum & magnesium hydroxide-simethicone (MYLANTA) 500-450-40 MG/5ML suspension Take 10  mLs by mouth every 6 (six) hours as needed for indigestion.  Marland Kitchen aspirin 81 MG tablet Take 81 mg by mouth daily.  . benztropine (COGENTIN) 2 MG tablet Take 1 tablet by mouth 2 (two) times daily.  . budesonide-formoterol (SYMBICORT) 160-4.5 MCG/ACT inhaler Inhale 2 puffs into the lungs 2 (two) times daily.  . citalopram (CELEXA) 20 MG tablet Take 1 tablet by mouth daily.  . cyanocobalamin 2000 MCG tablet Take 2,000 mcg by mouth daily.  . divalproex (DEPAKOTE) 500 MG DR tablet Take 2 tablets by mouth. Take 1 tablet in morning and 2 tablets at night.  . fenofibrate micronized (LOFIBRA) 134 MG capsule Take 1 capsule by mouth daily.  Marland Kitchen ibuprofen (ADVIL,MOTRIN) 200 MG tablet Take 200 mg by mouth every 6 (six) hours as needed.  Marland Kitchen levothyroxine (SYNTHROID, LEVOTHROID) 25 MCG tablet Take 1 tablet by mouth daily.  . magnesium 30 MG tablet Take 30 mg by mouth. Daily by mouth.  . Multiple Vitamin (MULTIVITAMIN) tablet Take 1 tablet by mouth daily.  . Omega-3 Fatty Acids (FISH OIL) 1200 MG CAPS Take 1 capsule by mouth 2 (two) times daily.  Marland Kitchen omeprazole (PRILOSEC) 20 MG capsule Take 1 capsule by mouth daily.  . psyllium (METAMUCIL) 58.6 % powder Take 1 packet by mouth.  . ziprasidone (GEODON) 40 MG capsule Take 1 capsule by mouth. Take 1 tablet at bedtime. Take 1 tablet daily as needed.

## 2014-04-17 NOTE — Assessment & Plan Note (Signed)
Moderate persistent asthma stable at present Plan Pneumovax will be given Stay on symbicort two puff twice daily Follow up in 3 months and will give Prevnar 13

## 2014-05-23 DIAGNOSIS — E785 Hyperlipidemia, unspecified: Secondary | ICD-10-CM | POA: Diagnosis not present

## 2014-05-23 DIAGNOSIS — Z79899 Other long term (current) drug therapy: Secondary | ICD-10-CM | POA: Diagnosis not present

## 2014-05-23 DIAGNOSIS — J449 Chronic obstructive pulmonary disease, unspecified: Secondary | ICD-10-CM | POA: Diagnosis not present

## 2014-05-23 DIAGNOSIS — E039 Hypothyroidism, unspecified: Secondary | ICD-10-CM | POA: Diagnosis not present

## 2014-05-23 DIAGNOSIS — Z6841 Body Mass Index (BMI) 40.0 and over, adult: Secondary | ICD-10-CM | POA: Diagnosis not present

## 2014-05-23 DIAGNOSIS — F209 Schizophrenia, unspecified: Secondary | ICD-10-CM | POA: Diagnosis not present

## 2014-05-29 DIAGNOSIS — F250 Schizoaffective disorder, bipolar type: Secondary | ICD-10-CM | POA: Diagnosis not present

## 2014-08-06 ENCOUNTER — Ambulatory Visit: Payer: Medicaid Other | Admitting: Critical Care Medicine

## 2014-08-14 DIAGNOSIS — F250 Schizoaffective disorder, bipolar type: Secondary | ICD-10-CM | POA: Diagnosis not present

## 2014-08-22 DIAGNOSIS — E785 Hyperlipidemia, unspecified: Secondary | ICD-10-CM | POA: Diagnosis not present

## 2014-08-22 DIAGNOSIS — E039 Hypothyroidism, unspecified: Secondary | ICD-10-CM | POA: Diagnosis not present

## 2014-08-22 DIAGNOSIS — J449 Chronic obstructive pulmonary disease, unspecified: Secondary | ICD-10-CM | POA: Diagnosis not present

## 2014-10-03 DIAGNOSIS — R600 Localized edema: Secondary | ICD-10-CM | POA: Diagnosis not present

## 2014-10-03 DIAGNOSIS — Z23 Encounter for immunization: Secondary | ICD-10-CM | POA: Diagnosis not present

## 2014-10-03 DIAGNOSIS — E1165 Type 2 diabetes mellitus with hyperglycemia: Secondary | ICD-10-CM | POA: Diagnosis not present

## 2014-10-03 DIAGNOSIS — Z6841 Body Mass Index (BMI) 40.0 and over, adult: Secondary | ICD-10-CM | POA: Diagnosis not present

## 2014-10-03 DIAGNOSIS — Z79899 Other long term (current) drug therapy: Secondary | ICD-10-CM | POA: Diagnosis not present

## 2014-11-08 DIAGNOSIS — F250 Schizoaffective disorder, bipolar type: Secondary | ICD-10-CM | POA: Diagnosis not present

## 2014-11-26 DIAGNOSIS — Z6841 Body Mass Index (BMI) 40.0 and over, adult: Secondary | ICD-10-CM | POA: Diagnosis not present

## 2014-11-26 DIAGNOSIS — Z79899 Other long term (current) drug therapy: Secondary | ICD-10-CM | POA: Diagnosis not present

## 2014-11-26 DIAGNOSIS — J209 Acute bronchitis, unspecified: Secondary | ICD-10-CM | POA: Diagnosis not present

## 2014-12-03 DIAGNOSIS — J209 Acute bronchitis, unspecified: Secondary | ICD-10-CM | POA: Diagnosis not present

## 2014-12-03 DIAGNOSIS — Z6841 Body Mass Index (BMI) 40.0 and over, adult: Secondary | ICD-10-CM | POA: Diagnosis not present

## 2014-12-11 ENCOUNTER — Other Ambulatory Visit: Payer: Self-pay | Admitting: *Deleted

## 2014-12-11 MED ORDER — BUDESONIDE-FORMOTEROL FUMARATE 160-4.5 MCG/ACT IN AERO: 2.0000 | INHALATION_SPRAY | Freq: Two times a day (BID) | RESPIRATORY_TRACT | Status: DC

## 2014-12-23 DIAGNOSIS — E785 Hyperlipidemia, unspecified: Secondary | ICD-10-CM | POA: Diagnosis not present

## 2014-12-23 DIAGNOSIS — E039 Hypothyroidism, unspecified: Secondary | ICD-10-CM | POA: Diagnosis not present

## 2014-12-23 DIAGNOSIS — J449 Chronic obstructive pulmonary disease, unspecified: Secondary | ICD-10-CM | POA: Diagnosis not present

## 2014-12-23 DIAGNOSIS — E1165 Type 2 diabetes mellitus with hyperglycemia: Secondary | ICD-10-CM | POA: Diagnosis not present

## 2015-01-16 DIAGNOSIS — F250 Schizoaffective disorder, bipolar type: Secondary | ICD-10-CM | POA: Diagnosis not present

## 2015-01-27 DIAGNOSIS — F250 Schizoaffective disorder, bipolar type: Secondary | ICD-10-CM | POA: Diagnosis not present

## 2015-02-17 DIAGNOSIS — H25813 Combined forms of age-related cataract, bilateral: Secondary | ICD-10-CM | POA: Diagnosis not present

## 2015-02-27 DIAGNOSIS — F250 Schizoaffective disorder, bipolar type: Secondary | ICD-10-CM | POA: Diagnosis not present

## 2015-03-04 DIAGNOSIS — E1165 Type 2 diabetes mellitus with hyperglycemia: Secondary | ICD-10-CM | POA: Diagnosis not present

## 2015-03-04 DIAGNOSIS — E039 Hypothyroidism, unspecified: Secondary | ICD-10-CM | POA: Diagnosis not present

## 2015-03-04 DIAGNOSIS — E785 Hyperlipidemia, unspecified: Secondary | ICD-10-CM | POA: Diagnosis not present

## 2015-03-04 DIAGNOSIS — Z79899 Other long term (current) drug therapy: Secondary | ICD-10-CM | POA: Diagnosis not present

## 2015-03-04 DIAGNOSIS — J449 Chronic obstructive pulmonary disease, unspecified: Secondary | ICD-10-CM | POA: Diagnosis not present

## 2015-04-03 DIAGNOSIS — F250 Schizoaffective disorder, bipolar type: Secondary | ICD-10-CM | POA: Diagnosis not present

## 2015-04-22 DIAGNOSIS — H25811 Combined forms of age-related cataract, right eye: Secondary | ICD-10-CM | POA: Diagnosis not present

## 2015-05-15 DIAGNOSIS — F250 Schizoaffective disorder, bipolar type: Secondary | ICD-10-CM | POA: Diagnosis not present

## 2015-06-04 DIAGNOSIS — E785 Hyperlipidemia, unspecified: Secondary | ICD-10-CM | POA: Diagnosis not present

## 2015-06-04 DIAGNOSIS — Z79899 Other long term (current) drug therapy: Secondary | ICD-10-CM | POA: Diagnosis not present

## 2015-06-04 DIAGNOSIS — Z125 Encounter for screening for malignant neoplasm of prostate: Secondary | ICD-10-CM | POA: Diagnosis not present

## 2015-06-04 DIAGNOSIS — J449 Chronic obstructive pulmonary disease, unspecified: Secondary | ICD-10-CM | POA: Diagnosis not present

## 2015-06-04 DIAGNOSIS — E1165 Type 2 diabetes mellitus with hyperglycemia: Secondary | ICD-10-CM | POA: Diagnosis not present

## 2015-06-04 DIAGNOSIS — E039 Hypothyroidism, unspecified: Secondary | ICD-10-CM | POA: Diagnosis not present

## 2015-06-05 DIAGNOSIS — H25811 Combined forms of age-related cataract, right eye: Secondary | ICD-10-CM | POA: Diagnosis not present

## 2015-06-05 DIAGNOSIS — H2511 Age-related nuclear cataract, right eye: Secondary | ICD-10-CM | POA: Diagnosis not present

## 2015-07-17 DIAGNOSIS — F250 Schizoaffective disorder, bipolar type: Secondary | ICD-10-CM | POA: Diagnosis not present

## 2015-07-21 DIAGNOSIS — K649 Unspecified hemorrhoids: Secondary | ICD-10-CM | POA: Diagnosis not present

## 2015-07-21 DIAGNOSIS — K5909 Other constipation: Secondary | ICD-10-CM | POA: Diagnosis not present

## 2015-07-21 DIAGNOSIS — Z6841 Body Mass Index (BMI) 40.0 and over, adult: Secondary | ICD-10-CM | POA: Diagnosis not present

## 2015-08-12 DIAGNOSIS — K602 Anal fissure, unspecified: Secondary | ICD-10-CM | POA: Diagnosis not present

## 2015-08-12 DIAGNOSIS — R1032 Left lower quadrant pain: Secondary | ICD-10-CM | POA: Diagnosis not present

## 2015-08-12 DIAGNOSIS — K625 Hemorrhage of anus and rectum: Secondary | ICD-10-CM | POA: Diagnosis not present

## 2015-08-12 DIAGNOSIS — K5900 Constipation, unspecified: Secondary | ICD-10-CM | POA: Diagnosis not present

## 2015-08-18 DIAGNOSIS — K625 Hemorrhage of anus and rectum: Secondary | ICD-10-CM | POA: Diagnosis not present

## 2015-08-19 ENCOUNTER — Other Ambulatory Visit: Payer: Self-pay | Admitting: Adult Health

## 2015-09-01 ENCOUNTER — Other Ambulatory Visit: Payer: Self-pay | Admitting: Adult Health

## 2015-10-02 DIAGNOSIS — Z79899 Other long term (current) drug therapy: Secondary | ICD-10-CM | POA: Diagnosis not present

## 2015-10-02 DIAGNOSIS — J449 Chronic obstructive pulmonary disease, unspecified: Secondary | ICD-10-CM | POA: Diagnosis not present

## 2015-10-02 DIAGNOSIS — E785 Hyperlipidemia, unspecified: Secondary | ICD-10-CM | POA: Diagnosis not present

## 2015-10-02 DIAGNOSIS — E039 Hypothyroidism, unspecified: Secondary | ICD-10-CM | POA: Diagnosis not present

## 2015-10-02 DIAGNOSIS — E1165 Type 2 diabetes mellitus with hyperglycemia: Secondary | ICD-10-CM | POA: Diagnosis not present

## 2015-10-08 DIAGNOSIS — F250 Schizoaffective disorder, bipolar type: Secondary | ICD-10-CM | POA: Diagnosis not present

## 2015-10-11 ENCOUNTER — Other Ambulatory Visit: Payer: Self-pay | Admitting: Adult Health

## 2015-10-15 ENCOUNTER — Ambulatory Visit: Payer: Medicaid Other | Admitting: Pulmonary Disease

## 2015-10-22 DIAGNOSIS — Z23 Encounter for immunization: Secondary | ICD-10-CM | POA: Diagnosis not present

## 2015-11-05 DIAGNOSIS — E785 Hyperlipidemia, unspecified: Secondary | ICD-10-CM | POA: Diagnosis not present

## 2015-11-12 DIAGNOSIS — F250 Schizoaffective disorder, bipolar type: Secondary | ICD-10-CM | POA: Diagnosis not present

## 2015-12-10 DIAGNOSIS — F250 Schizoaffective disorder, bipolar type: Secondary | ICD-10-CM | POA: Diagnosis not present

## 2015-12-11 DIAGNOSIS — E785 Hyperlipidemia, unspecified: Secondary | ICD-10-CM | POA: Diagnosis not present

## 2015-12-11 DIAGNOSIS — Z6841 Body Mass Index (BMI) 40.0 and over, adult: Secondary | ICD-10-CM | POA: Diagnosis not present

## 2015-12-11 DIAGNOSIS — J449 Chronic obstructive pulmonary disease, unspecified: Secondary | ICD-10-CM | POA: Diagnosis not present

## 2015-12-11 DIAGNOSIS — Z79899 Other long term (current) drug therapy: Secondary | ICD-10-CM | POA: Diagnosis not present

## 2015-12-11 DIAGNOSIS — E1165 Type 2 diabetes mellitus with hyperglycemia: Secondary | ICD-10-CM | POA: Diagnosis not present

## 2015-12-11 DIAGNOSIS — E039 Hypothyroidism, unspecified: Secondary | ICD-10-CM | POA: Diagnosis not present

## 2015-12-11 DIAGNOSIS — F209 Schizophrenia, unspecified: Secondary | ICD-10-CM | POA: Diagnosis not present

## 2016-01-01 ENCOUNTER — Other Ambulatory Visit: Payer: Self-pay | Admitting: Adult Health

## 2016-01-14 DIAGNOSIS — F250 Schizoaffective disorder, bipolar type: Secondary | ICD-10-CM | POA: Diagnosis not present

## 2016-02-10 DIAGNOSIS — E1140 Type 2 diabetes mellitus with diabetic neuropathy, unspecified: Secondary | ICD-10-CM | POA: Diagnosis not present

## 2016-02-10 DIAGNOSIS — Z79899 Other long term (current) drug therapy: Secondary | ICD-10-CM | POA: Diagnosis not present

## 2016-02-18 DIAGNOSIS — F250 Schizoaffective disorder, bipolar type: Secondary | ICD-10-CM | POA: Diagnosis not present

## 2016-02-20 DIAGNOSIS — E1140 Type 2 diabetes mellitus with diabetic neuropathy, unspecified: Secondary | ICD-10-CM | POA: Diagnosis not present

## 2016-02-20 DIAGNOSIS — Z1389 Encounter for screening for other disorder: Secondary | ICD-10-CM | POA: Diagnosis not present

## 2016-02-20 DIAGNOSIS — Z0000 Encounter for general adult medical examination without abnormal findings: Secondary | ICD-10-CM | POA: Diagnosis not present

## 2016-02-20 DIAGNOSIS — Z125 Encounter for screening for malignant neoplasm of prostate: Secondary | ICD-10-CM | POA: Diagnosis not present

## 2016-02-20 DIAGNOSIS — Z9181 History of falling: Secondary | ICD-10-CM | POA: Diagnosis not present

## 2016-02-20 DIAGNOSIS — E669 Obesity, unspecified: Secondary | ICD-10-CM | POA: Diagnosis not present

## 2016-02-20 DIAGNOSIS — Z136 Encounter for screening for cardiovascular disorders: Secondary | ICD-10-CM | POA: Diagnosis not present

## 2016-02-23 DIAGNOSIS — M19071 Primary osteoarthritis, right ankle and foot: Secondary | ICD-10-CM | POA: Diagnosis not present

## 2016-02-23 DIAGNOSIS — E119 Type 2 diabetes mellitus without complications: Secondary | ICD-10-CM | POA: Diagnosis not present

## 2016-02-23 DIAGNOSIS — M19072 Primary osteoarthritis, left ankle and foot: Secondary | ICD-10-CM | POA: Diagnosis not present

## 2016-02-24 DIAGNOSIS — H25812 Combined forms of age-related cataract, left eye: Secondary | ICD-10-CM | POA: Diagnosis not present

## 2016-02-24 DIAGNOSIS — E119 Type 2 diabetes mellitus without complications: Secondary | ICD-10-CM | POA: Diagnosis not present

## 2016-02-24 DIAGNOSIS — Z961 Presence of intraocular lens: Secondary | ICD-10-CM | POA: Diagnosis not present

## 2016-02-25 DIAGNOSIS — E1140 Type 2 diabetes mellitus with diabetic neuropathy, unspecified: Secondary | ICD-10-CM | POA: Diagnosis not present

## 2016-02-25 DIAGNOSIS — Z6841 Body Mass Index (BMI) 40.0 and over, adult: Secondary | ICD-10-CM | POA: Diagnosis not present

## 2016-04-07 DIAGNOSIS — F250 Schizoaffective disorder, bipolar type: Secondary | ICD-10-CM | POA: Diagnosis not present

## 2016-05-19 DIAGNOSIS — F250 Schizoaffective disorder, bipolar type: Secondary | ICD-10-CM | POA: Diagnosis not present

## 2016-05-27 DIAGNOSIS — Z79899 Other long term (current) drug therapy: Secondary | ICD-10-CM | POA: Diagnosis not present

## 2016-05-27 DIAGNOSIS — F250 Schizoaffective disorder, bipolar type: Secondary | ICD-10-CM | POA: Diagnosis not present

## 2016-06-15 DIAGNOSIS — E785 Hyperlipidemia, unspecified: Secondary | ICD-10-CM | POA: Diagnosis not present

## 2016-06-15 DIAGNOSIS — Z79899 Other long term (current) drug therapy: Secondary | ICD-10-CM | POA: Diagnosis not present

## 2016-06-15 DIAGNOSIS — E039 Hypothyroidism, unspecified: Secondary | ICD-10-CM | POA: Diagnosis not present

## 2016-06-15 DIAGNOSIS — J449 Chronic obstructive pulmonary disease, unspecified: Secondary | ICD-10-CM | POA: Diagnosis not present

## 2016-06-15 DIAGNOSIS — M1990 Unspecified osteoarthritis, unspecified site: Secondary | ICD-10-CM | POA: Diagnosis not present

## 2016-06-15 DIAGNOSIS — E229 Hyperfunction of pituitary gland, unspecified: Secondary | ICD-10-CM | POA: Diagnosis not present

## 2016-06-15 DIAGNOSIS — E1140 Type 2 diabetes mellitus with diabetic neuropathy, unspecified: Secondary | ICD-10-CM | POA: Diagnosis not present

## 2016-06-15 DIAGNOSIS — Z6841 Body Mass Index (BMI) 40.0 and over, adult: Secondary | ICD-10-CM | POA: Diagnosis not present

## 2016-06-15 DIAGNOSIS — F209 Schizophrenia, unspecified: Secondary | ICD-10-CM | POA: Diagnosis not present

## 2016-06-16 DIAGNOSIS — F250 Schizoaffective disorder, bipolar type: Secondary | ICD-10-CM | POA: Diagnosis not present

## 2016-06-30 DIAGNOSIS — E229 Hyperfunction of pituitary gland, unspecified: Secondary | ICD-10-CM | POA: Diagnosis not present

## 2016-06-30 DIAGNOSIS — R51 Headache: Secondary | ICD-10-CM | POA: Diagnosis not present

## 2016-07-21 DIAGNOSIS — F250 Schizoaffective disorder, bipolar type: Secondary | ICD-10-CM | POA: Diagnosis not present

## 2016-08-18 DIAGNOSIS — F250 Schizoaffective disorder, bipolar type: Secondary | ICD-10-CM | POA: Diagnosis not present

## 2016-09-22 DIAGNOSIS — F250 Schizoaffective disorder, bipolar type: Secondary | ICD-10-CM | POA: Diagnosis not present

## 2016-10-20 DIAGNOSIS — F250 Schizoaffective disorder, bipolar type: Secondary | ICD-10-CM | POA: Diagnosis not present

## 2016-10-26 DIAGNOSIS — E229 Hyperfunction of pituitary gland, unspecified: Secondary | ICD-10-CM | POA: Diagnosis not present

## 2016-10-26 DIAGNOSIS — Z79899 Other long term (current) drug therapy: Secondary | ICD-10-CM | POA: Diagnosis not present

## 2016-10-26 DIAGNOSIS — Z6841 Body Mass Index (BMI) 40.0 and over, adult: Secondary | ICD-10-CM | POA: Diagnosis not present

## 2016-10-26 DIAGNOSIS — E1140 Type 2 diabetes mellitus with diabetic neuropathy, unspecified: Secondary | ICD-10-CM | POA: Diagnosis not present

## 2016-10-26 DIAGNOSIS — J449 Chronic obstructive pulmonary disease, unspecified: Secondary | ICD-10-CM | POA: Diagnosis not present

## 2016-10-26 DIAGNOSIS — E785 Hyperlipidemia, unspecified: Secondary | ICD-10-CM | POA: Diagnosis not present

## 2016-10-26 DIAGNOSIS — M1990 Unspecified osteoarthritis, unspecified site: Secondary | ICD-10-CM | POA: Diagnosis not present

## 2016-10-26 DIAGNOSIS — E039 Hypothyroidism, unspecified: Secondary | ICD-10-CM | POA: Diagnosis not present

## 2016-10-26 DIAGNOSIS — F209 Schizophrenia, unspecified: Secondary | ICD-10-CM | POA: Diagnosis not present

## 2016-10-26 DIAGNOSIS — Z23 Encounter for immunization: Secondary | ICD-10-CM | POA: Diagnosis not present

## 2016-11-10 DIAGNOSIS — F250 Schizoaffective disorder, bipolar type: Secondary | ICD-10-CM | POA: Diagnosis not present

## 2016-12-02 DIAGNOSIS — J449 Chronic obstructive pulmonary disease, unspecified: Secondary | ICD-10-CM | POA: Diagnosis not present

## 2016-12-02 DIAGNOSIS — Z6841 Body Mass Index (BMI) 40.0 and over, adult: Secondary | ICD-10-CM | POA: Diagnosis not present

## 2016-12-02 DIAGNOSIS — J441 Chronic obstructive pulmonary disease with (acute) exacerbation: Secondary | ICD-10-CM | POA: Diagnosis not present

## 2016-12-15 DIAGNOSIS — F250 Schizoaffective disorder, bipolar type: Secondary | ICD-10-CM | POA: Diagnosis not present

## 2016-12-22 DIAGNOSIS — J449 Chronic obstructive pulmonary disease, unspecified: Secondary | ICD-10-CM | POA: Diagnosis not present

## 2017-01-10 DIAGNOSIS — M1611 Unilateral primary osteoarthritis, right hip: Secondary | ICD-10-CM | POA: Diagnosis not present

## 2017-01-10 DIAGNOSIS — Z79899 Other long term (current) drug therapy: Secondary | ICD-10-CM | POA: Diagnosis not present

## 2017-01-10 DIAGNOSIS — Z6841 Body Mass Index (BMI) 40.0 and over, adult: Secondary | ICD-10-CM | POA: Diagnosis not present

## 2017-01-11 DIAGNOSIS — M1611 Unilateral primary osteoarthritis, right hip: Secondary | ICD-10-CM | POA: Diagnosis not present

## 2017-01-11 DIAGNOSIS — M25551 Pain in right hip: Secondary | ICD-10-CM | POA: Diagnosis not present

## 2017-01-24 DIAGNOSIS — Z79899 Other long term (current) drug therapy: Secondary | ICD-10-CM | POA: Diagnosis not present

## 2017-01-24 DIAGNOSIS — M25551 Pain in right hip: Secondary | ICD-10-CM | POA: Diagnosis not present

## 2017-01-24 DIAGNOSIS — Z6841 Body Mass Index (BMI) 40.0 and over, adult: Secondary | ICD-10-CM | POA: Diagnosis not present

## 2017-01-24 DIAGNOSIS — F209 Schizophrenia, unspecified: Secondary | ICD-10-CM | POA: Diagnosis not present

## 2017-02-07 DIAGNOSIS — Z79899 Other long term (current) drug therapy: Secondary | ICD-10-CM | POA: Diagnosis not present

## 2017-02-07 DIAGNOSIS — J441 Chronic obstructive pulmonary disease with (acute) exacerbation: Secondary | ICD-10-CM | POA: Diagnosis not present

## 2017-02-07 DIAGNOSIS — R05 Cough: Secondary | ICD-10-CM | POA: Diagnosis not present

## 2017-02-18 DIAGNOSIS — E785 Hyperlipidemia, unspecified: Secondary | ICD-10-CM | POA: Diagnosis not present

## 2017-02-18 DIAGNOSIS — Z125 Encounter for screening for malignant neoplasm of prostate: Secondary | ICD-10-CM | POA: Diagnosis not present

## 2017-02-18 DIAGNOSIS — Z0000 Encounter for general adult medical examination without abnormal findings: Secondary | ICD-10-CM | POA: Diagnosis not present

## 2017-02-18 DIAGNOSIS — Z6841 Body Mass Index (BMI) 40.0 and over, adult: Secondary | ICD-10-CM | POA: Diagnosis not present

## 2017-02-18 DIAGNOSIS — Z9181 History of falling: Secondary | ICD-10-CM | POA: Diagnosis not present

## 2017-02-18 DIAGNOSIS — Z1331 Encounter for screening for depression: Secondary | ICD-10-CM | POA: Diagnosis not present

## 2017-03-02 DIAGNOSIS — F250 Schizoaffective disorder, bipolar type: Secondary | ICD-10-CM | POA: Diagnosis not present

## 2017-03-03 DIAGNOSIS — Z6841 Body Mass Index (BMI) 40.0 and over, adult: Secondary | ICD-10-CM | POA: Diagnosis not present

## 2017-03-03 DIAGNOSIS — Z79899 Other long term (current) drug therapy: Secondary | ICD-10-CM | POA: Diagnosis not present

## 2017-03-03 DIAGNOSIS — E229 Hyperfunction of pituitary gland, unspecified: Secondary | ICD-10-CM | POA: Diagnosis not present

## 2017-03-03 DIAGNOSIS — E1140 Type 2 diabetes mellitus with diabetic neuropathy, unspecified: Secondary | ICD-10-CM | POA: Diagnosis not present

## 2017-03-03 DIAGNOSIS — E039 Hypothyroidism, unspecified: Secondary | ICD-10-CM | POA: Diagnosis not present

## 2017-03-03 DIAGNOSIS — E785 Hyperlipidemia, unspecified: Secondary | ICD-10-CM | POA: Diagnosis not present

## 2017-03-03 DIAGNOSIS — F209 Schizophrenia, unspecified: Secondary | ICD-10-CM | POA: Diagnosis not present

## 2017-03-03 DIAGNOSIS — J449 Chronic obstructive pulmonary disease, unspecified: Secondary | ICD-10-CM | POA: Diagnosis not present

## 2017-03-03 DIAGNOSIS — M1611 Unilateral primary osteoarthritis, right hip: Secondary | ICD-10-CM | POA: Diagnosis not present

## 2017-03-03 DIAGNOSIS — Z125 Encounter for screening for malignant neoplasm of prostate: Secondary | ICD-10-CM | POA: Diagnosis not present

## 2017-04-27 DIAGNOSIS — Z79899 Other long term (current) drug therapy: Secondary | ICD-10-CM | POA: Diagnosis not present

## 2017-04-27 DIAGNOSIS — K429 Umbilical hernia without obstruction or gangrene: Secondary | ICD-10-CM | POA: Diagnosis not present

## 2017-04-27 DIAGNOSIS — Z6841 Body Mass Index (BMI) 40.0 and over, adult: Secondary | ICD-10-CM | POA: Diagnosis not present

## 2017-04-27 DIAGNOSIS — R339 Retention of urine, unspecified: Secondary | ICD-10-CM | POA: Diagnosis not present

## 2017-05-18 DIAGNOSIS — F250 Schizoaffective disorder, bipolar type: Secondary | ICD-10-CM | POA: Diagnosis not present

## 2017-06-30 DIAGNOSIS — J449 Chronic obstructive pulmonary disease, unspecified: Secondary | ICD-10-CM | POA: Diagnosis not present

## 2017-06-30 DIAGNOSIS — E1140 Type 2 diabetes mellitus with diabetic neuropathy, unspecified: Secondary | ICD-10-CM | POA: Diagnosis not present

## 2017-06-30 DIAGNOSIS — Z79899 Other long term (current) drug therapy: Secondary | ICD-10-CM | POA: Diagnosis not present

## 2017-06-30 DIAGNOSIS — E229 Hyperfunction of pituitary gland, unspecified: Secondary | ICD-10-CM | POA: Diagnosis not present

## 2017-06-30 DIAGNOSIS — Z1339 Encounter for screening examination for other mental health and behavioral disorders: Secondary | ICD-10-CM | POA: Diagnosis not present

## 2017-06-30 DIAGNOSIS — E039 Hypothyroidism, unspecified: Secondary | ICD-10-CM | POA: Diagnosis not present

## 2017-07-06 DIAGNOSIS — E119 Type 2 diabetes mellitus without complications: Secondary | ICD-10-CM | POA: Diagnosis not present

## 2017-07-06 DIAGNOSIS — Z961 Presence of intraocular lens: Secondary | ICD-10-CM | POA: Diagnosis not present

## 2017-07-06 DIAGNOSIS — H25812 Combined forms of age-related cataract, left eye: Secondary | ICD-10-CM | POA: Diagnosis not present

## 2017-08-04 DIAGNOSIS — F250 Schizoaffective disorder, bipolar type: Secondary | ICD-10-CM | POA: Diagnosis not present

## 2017-08-10 DIAGNOSIS — Z79899 Other long term (current) drug therapy: Secondary | ICD-10-CM | POA: Diagnosis not present

## 2017-08-11 ENCOUNTER — Ambulatory Visit (INDEPENDENT_AMBULATORY_CARE_PROVIDER_SITE_OTHER): Payer: Medicare Other | Admitting: Sports Medicine

## 2017-08-11 ENCOUNTER — Ambulatory Visit: Payer: Medicare Other | Admitting: *Deleted

## 2017-08-11 ENCOUNTER — Encounter: Payer: Self-pay | Admitting: Sports Medicine

## 2017-08-11 VITALS — BP 138/70 | HR 70 | Temp 98.0°F | Resp 15

## 2017-08-11 DIAGNOSIS — M79675 Pain in left toe(s): Secondary | ICD-10-CM | POA: Diagnosis not present

## 2017-08-11 DIAGNOSIS — M2042 Other hammer toe(s) (acquired), left foot: Secondary | ICD-10-CM | POA: Diagnosis not present

## 2017-08-11 DIAGNOSIS — E119 Type 2 diabetes mellitus without complications: Secondary | ICD-10-CM | POA: Diagnosis not present

## 2017-08-11 DIAGNOSIS — M2041 Other hammer toe(s) (acquired), right foot: Secondary | ICD-10-CM

## 2017-08-11 DIAGNOSIS — M79674 Pain in right toe(s): Secondary | ICD-10-CM

## 2017-08-11 DIAGNOSIS — M2021 Hallux rigidus, right foot: Secondary | ICD-10-CM | POA: Diagnosis not present

## 2017-08-11 DIAGNOSIS — M2022 Hallux rigidus, left foot: Secondary | ICD-10-CM | POA: Diagnosis not present

## 2017-08-11 DIAGNOSIS — B351 Tinea unguium: Secondary | ICD-10-CM | POA: Diagnosis not present

## 2017-08-11 DIAGNOSIS — M216X9 Other acquired deformities of unspecified foot: Secondary | ICD-10-CM

## 2017-08-11 NOTE — Progress Notes (Signed)
Subjective: Dustin Barton is a 62 y.o. male patient with history of diabetes who presents to office today for diabetic foot exam and complaining of long,mildly painful nails  while ambulating in shoes; unable to trim. Patient states that the glucose reading this morning was not recorded but 2 weeks ago was 250 mg/dl. Patient denies any new changes in medication or new problems. Reports ocassional tingling. Diagnosed with diabetes 2.5 years ago after being borderline for several years. Denies any other pedal complaints at this time.  Review of Systems  Musculoskeletal: Positive for joint pain.  All other systems reviewed and are negative.    Patient Active Problem List   Diagnosis Date Noted  . Severe obesity (BMI >= 40) ([place]) 10/16/2013  . Osteoarthritis   . Moderate persistent asthma with acute exacerbation   . Hypothyroidism   . Schizophrenia ([place])   . Hyperlipidemia   . Bipolar 1 disorder ([place])    Current Outpatient Medications on File Prior to Visit  Medication Sig Dispense Refill  . ACCU-CHEK FASTCLIX LANCETS MISC USE 1 DAILY  5  . albuterol (PROAIR HFA) 108 (90 BASE) MCG/ACT inhaler Inhale 2 puffs into the lungs every 4 (four) hours as needed for wheezing or shortness of breath. 1 Inhaler 6  . aluminum & magnesium hydroxide-simethicone (MYLANTA) 500-450-40 MG/5ML suspension Take 10 mLs by mouth every 6 (six) hours as needed for indigestion.    . ARIPiprazole (ABILIFY) 20 MG tablet Take 20 mg by mouth at bedtime.  3  . ARIPiprazole (ABILIFY) 30 MG tablet TAKE 1 TABLET BY MOUTH EVERY DAY IN THE MORNING  3  . aspirin 81 MG tablet Take 81 mg by mouth daily.    . benztropine (COGENTIN) 1 MG tablet Take 1 mg by mouth daily.  3  . benztropine (COGENTIN) 2 MG tablet Take 1 tablet by mouth 2 (two) times daily.    Marland Kitchen buPROPion (WELLBUTRIN SR) 150 MG 12 hr tablet TAKE ONE TABLET BY MOUTH EACH MORNING  0  . buPROPion (ZYBAN) 150 MG 12 hr tablet     . citalopram (CELEXA) 20 MG tablet Take  1 tablet by mouth daily.    . cyanocobalamin 2000 MCG tablet Take 2,000 mcg by mouth daily.    . divalproex (DEPAKOTE) 500 MG DR tablet Take 2 tablets by mouth. Take 1 tablet in morning and 2 tablets at night.    Marland Kitchen FARXIGA 10 MG TABS tablet Take 10 mg by mouth daily.  3  . fenofibrate micronized (LOFIBRA) 134 MG capsule Take 1 capsule by mouth daily.    Marland Kitchen ibuprofen (ADVIL,MOTRIN) 200 MG tablet Take 200 mg by mouth every 6 (six) hours as needed.    Marland Kitchen levothyroxine (SYNTHROID, LEVOTHROID) 25 MCG tablet Take 1 tablet by mouth daily.    . magnesium 30 MG tablet Take 30 mg by mouth. Daily by mouth.    . Multiple Vitamin (MULTIVITAMIN) tablet Take 1 tablet by mouth daily.    . Omega-3 Fatty Acids (FISH OIL) 1200 MG CAPS Take 1 capsule by mouth 2 (two) times daily.    Marland Kitchen omeprazole (PRILOSEC) 20 MG capsule Take 1 capsule by mouth daily.    . psyllium (METAMUCIL) 58.6 % powder Take 1 packet by mouth.    . SYMBICORT 160-4.5 MCG/ACT inhaler INHALE 2 PUFFS INTO THE LUNGS 2 TIMES A DAY 10.2 Inhaler 0  . tamsulosin (FLOMAX) 0.4 MG CAPS capsule TAKE 1 CAPSULE BY MOUTH EVERY DAY AT BEDTIME  1  . ziprasidone (GEODON) 40 MG  capsule Take 1 capsule by mouth. Take 1 tablet at bedtime. Take 1 tablet daily as needed.     No current facility-administered medications on file prior to visit.    Allergies  Allergen Reactions  . Artane [Trihexyphenidyl]   . Haldol [Haloperidol]     No results found for this or any previous visit (from the past 2160 hour(s)).  Objective: General: Patient is awake, alert, and oriented x 3 and in no acute distress.  Integument: Skin is warm, dry and supple bilateral. Nails are tender, mildly elongated, thickened and  dystrophic with subungual debris, consistent with onychomycosis, 1-5 bilateral. No signs of infection. No open lesions or preulcerative lesions present bilateral. Remaining integument unremarkable.  Vasculature:  Dorsalis Pedis pulse 2/4 bilateral. Posterior Tibial  pulse  1/4 bilateral.  Capillary fill time <3 sec 1-5 bilateral. Positive hair growth to the level of the digits. Temperature gradient within normal limits. + varicosities present bilateral. No edema present bilateral.   Neurology: The patient has intact sensation measured with a 5.07/10g Semmes Weinstein Monofilament at all pedal sites bilateral . Vibratory sensation diminished bilateral with tuning fork. No Babinski sign present bilateral.   Musculoskeletal: Asymptomatic limited ROM 1st mtpjs, hammertoes and pes cavus pedal deformities noted bilateral. Muscular strength 5/5 in all lower extremity muscular groups bilateral without pain on range of motion . No tenderness with calf compression bilateral.  Assessment and Plan: Problem List Items Addressed This Visit    None    Visit Diagnoses    Encounter for comprehensive diabetic foot examination, type 2 diabetes mellitus ([place])    -  Primary   Relevant Medications   FARXIGA 10 MG TABS tablet   Pain due to onychomycosis of toenails of both feet       Hammer toes of both feet       Hallux rigidus of both feet       Cavus deformity of foot, acquired          -Examined patient. -Discussed and educated patient on diabetic foot care, especially with  regards to the vascular, neurological and musculoskeletal systems.  -Stressed the importance of good glycemic control and the detriment of not  controlling glucose levels in relation to the foot. -Mechanically debrided all nails 1-5 bilateral using sterile nail nipper and filed with dremel without incident  -[employer] diabetic shoe order form was completed; office to contact primary care for approval / certification;  Office to arrange shoe fitting and dispensing. -Answered all patient questions -Patient to return for diabetic shoe measurements asap and diabetic nail trim in 3 months -Patient advised to call the office if any problems or questions arise in the meantime.  Landis Martins, DPM

## 2017-08-11 NOTE — Patient Instructions (Signed)

## 2017-08-23 DIAGNOSIS — Z125 Encounter for screening for malignant neoplasm of prostate: Secondary | ICD-10-CM | POA: Diagnosis not present

## 2017-08-23 DIAGNOSIS — R351 Nocturia: Secondary | ICD-10-CM | POA: Diagnosis not present

## 2017-08-23 DIAGNOSIS — N401 Enlarged prostate with lower urinary tract symptoms: Secondary | ICD-10-CM | POA: Diagnosis not present

## 2017-09-15 ENCOUNTER — Ambulatory Visit (INDEPENDENT_AMBULATORY_CARE_PROVIDER_SITE_OTHER): Payer: Medicare Other | Admitting: Sports Medicine

## 2017-09-15 ENCOUNTER — Encounter: Payer: Self-pay | Admitting: Sports Medicine

## 2017-09-15 DIAGNOSIS — M2022 Hallux rigidus, left foot: Secondary | ICD-10-CM | POA: Diagnosis not present

## 2017-09-15 DIAGNOSIS — M2021 Hallux rigidus, right foot: Secondary | ICD-10-CM | POA: Diagnosis not present

## 2017-09-15 DIAGNOSIS — M216X9 Other acquired deformities of unspecified foot: Secondary | ICD-10-CM | POA: Diagnosis not present

## 2017-09-15 DIAGNOSIS — M2042 Other hammer toe(s) (acquired), left foot: Secondary | ICD-10-CM | POA: Diagnosis not present

## 2017-09-15 DIAGNOSIS — E1142 Type 2 diabetes mellitus with diabetic polyneuropathy: Secondary | ICD-10-CM | POA: Diagnosis not present

## 2017-09-15 DIAGNOSIS — M2041 Other hammer toe(s) (acquired), right foot: Secondary | ICD-10-CM

## 2017-09-15 NOTE — Patient Instructions (Signed)

## 2017-09-15 NOTE — Progress Notes (Signed)
Patient ID: Dustin Barton, male   DOB: 10/23/1955, 62 y.o.   MRN: [id number]   Patient presents for diabetic shoe pick up, shoes are tried on for good fit.  Patient received 1 pair OrthoFeet Men - [place] Black Leather Strap - 510 in men's 9.5 extra wide and 3 pairs custom molded diabetic inserts.  Verbal and written break in and wear instructions given.  Patient will follow up for scheduled routine care.

## 2017-09-15 NOTE — Progress Notes (Signed)
Patient discussed with medical assistant. Agree with note. Patient to follow up as scheduled for continued care or sooner if problems or issues arise. -Dr. Bryley Kovacevic 

## 2017-09-22 DIAGNOSIS — Z125 Encounter for screening for malignant neoplasm of prostate: Secondary | ICD-10-CM | POA: Diagnosis not present

## 2017-09-22 DIAGNOSIS — N401 Enlarged prostate with lower urinary tract symptoms: Secondary | ICD-10-CM | POA: Diagnosis not present

## 2017-09-22 DIAGNOSIS — R81 Glycosuria: Secondary | ICD-10-CM | POA: Diagnosis not present

## 2017-11-01 DIAGNOSIS — E039 Hypothyroidism, unspecified: Secondary | ICD-10-CM | POA: Diagnosis not present

## 2017-11-01 DIAGNOSIS — E229 Hyperfunction of pituitary gland, unspecified: Secondary | ICD-10-CM | POA: Diagnosis not present

## 2017-11-01 DIAGNOSIS — Z23 Encounter for immunization: Secondary | ICD-10-CM | POA: Diagnosis not present

## 2017-11-01 DIAGNOSIS — E1140 Type 2 diabetes mellitus with diabetic neuropathy, unspecified: Secondary | ICD-10-CM | POA: Diagnosis not present

## 2017-11-01 DIAGNOSIS — Z79899 Other long term (current) drug therapy: Secondary | ICD-10-CM | POA: Diagnosis not present

## 2017-11-01 DIAGNOSIS — J449 Chronic obstructive pulmonary disease, unspecified: Secondary | ICD-10-CM | POA: Diagnosis not present

## 2017-11-08 DIAGNOSIS — F250 Schizoaffective disorder, bipolar type: Secondary | ICD-10-CM | POA: Diagnosis not present

## 2017-11-10 ENCOUNTER — Ambulatory Visit (INDEPENDENT_AMBULATORY_CARE_PROVIDER_SITE_OTHER): Payer: Medicare Other | Admitting: Sports Medicine

## 2017-11-10 ENCOUNTER — Encounter: Payer: Self-pay | Admitting: Sports Medicine

## 2017-11-10 VITALS — BP 125/79 | HR 83 | Resp 16

## 2017-11-10 DIAGNOSIS — M79674 Pain in right toe(s): Secondary | ICD-10-CM | POA: Diagnosis not present

## 2017-11-10 DIAGNOSIS — E1142 Type 2 diabetes mellitus with diabetic polyneuropathy: Secondary | ICD-10-CM | POA: Diagnosis not present

## 2017-11-10 DIAGNOSIS — B351 Tinea unguium: Secondary | ICD-10-CM

## 2017-11-10 DIAGNOSIS — M216X9 Other acquired deformities of unspecified foot: Secondary | ICD-10-CM

## 2017-11-10 DIAGNOSIS — M2021 Hallux rigidus, right foot: Secondary | ICD-10-CM

## 2017-11-10 DIAGNOSIS — M79675 Pain in left toe(s): Secondary | ICD-10-CM | POA: Diagnosis not present

## 2017-11-10 DIAGNOSIS — M2022 Hallux rigidus, left foot: Secondary | ICD-10-CM

## 2017-11-10 DIAGNOSIS — M2042 Other hammer toe(s) (acquired), left foot: Secondary | ICD-10-CM

## 2017-11-10 DIAGNOSIS — M2041 Other hammer toe(s) (acquired), right foot: Secondary | ICD-10-CM

## 2017-11-10 NOTE — Progress Notes (Signed)
Subjective: Dustin Barton is a 62 y.o. male patient with history of diabetes who returns to office today for diabetic foot care. Patient reports that he will start Victoza today and FBS was >200 and A1c 11 saw PCP 2 weeks ago. Patient denies any other pedal complaints or issues at this time.    Patient Active Problem List   Diagnosis Date Noted  . Severe obesity (BMI >= 40) ([place]) 10/16/2013  . Osteoarthritis   . Moderate persistent asthma with acute exacerbation   . Hypothyroidism   . Schizophrenia ([place])   . Hyperlipidemia   . Bipolar 1 disorder ([place])    Current Outpatient Medications on File Prior to Visit  Medication Sig Dispense Refill  . ACCU-CHEK FASTCLIX LANCETS MISC USE 1 DAILY  5  . albuterol (PROAIR HFA) 108 (90 BASE) MCG/ACT inhaler Inhale 2 puffs into the lungs every 4 (four) hours as needed for wheezing or shortness of breath. 1 Inhaler 6  . ARIPiprazole (ABILIFY) 20 MG tablet Take 20 mg by mouth daily.    . benztropine (COGENTIN) 1 MG tablet Take 1 mg by mouth 2 (two) times daily.    Marland Kitchen buPROPion (WELLBUTRIN SR) 150 MG 12 hr tablet TAKE ONE TABLET BY MOUTH EACH MORNING  0  . cyanocobalamin 2000 MCG tablet Take 2,000 mcg by mouth daily.    . divalproex (DEPAKOTE) 500 MG DR tablet Take 2 tablets by mouth. Take 1 tablet in morning and 2 tablets at night.    Marland Kitchen FARXIGA 10 MG TABS tablet Take 10 mg by mouth daily.  3  . fenofibrate micronized (LOFIBRA) 134 MG capsule Take 1 capsule by mouth daily.    Marland Kitchen ibuprofen (ADVIL,MOTRIN) 200 MG tablet Take 200 mg by mouth every 6 (six) hours as needed.    . Inositol Niacinate 100 MG TABS Take by mouth.    . levothyroxine (SYNTHROID, LEVOTHROID) 25 MCG tablet Take 25 mcg by mouth daily before breakfast.    . Multiple Vitamin (MULTIVITAMIN) tablet Take 1 tablet by mouth daily.    . Omega-3 Fatty Acids (FISH OIL) 1200 MG CAPS Take 1 capsule by mouth 2 (two) times daily.    Marland Kitchen omeprazole (PRILOSEC) 20 MG capsule Take 1 capsule by mouth daily.     . tamsulosin (FLOMAX) 0.4 MG CAPS capsule TAKE 1 CAPSULE BY MOUTH EVERY DAY AT BEDTIME  1  . VICTOZA 18 MG/3ML SOPN      No current facility-administered medications on file prior to visit.    Allergies  Allergen Reactions  . Artane [Trihexyphenidyl]   . Haldol [Haloperidol]     No results found for this or any previous visit (from the past 2160 hour(s)).  Objective: General: Patient is awake, alert, and oriented x 3 and in no acute distress.  Integument: Skin is warm, dry and supple bilateral. Dry skin to heels. Nails are tender, mildly elongated, thickened and  dystrophic with subungual debris, consistent with onychomycosis, 1-5 bilateral. No signs of infection. No open lesions or preulcerative lesions present bilateral. Remaining integument unremarkable.  Vasculature:  Dorsalis Pedis pulse 2/4 bilateral. Posterior Tibial pulse  1/4 bilateral.  Capillary fill time <3 sec 1-5 bilateral. Positive hair growth to the level of the digits. Temperature gradient within normal limits. + varicosities present bilateral. No edema present bilateral.   Neurology: The patient has intact sensation measured with a 5.07/10g Semmes Weinstein Monofilament at all pedal sites bilateral . Vibratory sensation diminished bilateral with tuning fork. No Babinski sign present bilateral.  Musculoskeletal: Asymptomatic limited ROM 1st mtpjs, hammertoes and pes cavus pedal deformities noted bilateral. Muscular strength 5/5 in all lower extremity muscular groups bilateral without pain on range of motion . No tenderness with calf compression bilateral.  Assessment and Plan: Problem List Items Addressed This Visit    None    Visit Diagnoses    Pain due to onychomycosis of toenails of both feet    -  Primary   Type 2 diabetes mellitus with diabetic polyneuropathy, unspecified whether long term insulin use (HCC)       Relevant Medications   VICTOZA 18 MG/3ML SOPN   Hammer toes of both feet       Hallux  rigidus of both feet       Cavus deformity of foot, acquired          -Examined patient. -Discussed and educated patient on diabetic foot care, especially with  regards to the vascular, neurological and musculoskeletal systems.  -Mechanically debrided all nails 1-5 bilateral using sterile nail nipper and filed with dremel without incident  -Continue with Diabetic shoes  -Patient to return in 3 months for diabetic foot care -Patient advised to call the office if any problems or questions arise in the meantime.  Landis Martins, DPM

## 2017-11-17 NOTE — Progress Notes (Signed)
Patient ID: Dustin Barton, male   DOB: [birth year], 62 y.o.   MRN: [id number]   Patient presents at Dr Leeanne Rio request to be measured for diabetic shoes and inserts with [facility] Certified Pedorthist.  Patient will be called when shoes and inserts arrive to schedule a fitting.

## 2017-11-24 DIAGNOSIS — N401 Enlarged prostate with lower urinary tract symptoms: Secondary | ICD-10-CM | POA: Diagnosis not present

## 2017-11-24 DIAGNOSIS — R351 Nocturia: Secondary | ICD-10-CM | POA: Diagnosis not present

## 2017-12-27 DIAGNOSIS — Z6841 Body Mass Index (BMI) 40.0 and over, adult: Secondary | ICD-10-CM | POA: Diagnosis not present

## 2017-12-27 DIAGNOSIS — S46819A Strain of other muscles, fascia and tendons at shoulder and upper arm level, unspecified arm, initial encounter: Secondary | ICD-10-CM | POA: Diagnosis not present

## 2017-12-27 DIAGNOSIS — Z79899 Other long term (current) drug therapy: Secondary | ICD-10-CM | POA: Diagnosis not present

## 2018-02-10 ENCOUNTER — Encounter: Payer: Self-pay | Admitting: Sports Medicine

## 2018-02-10 ENCOUNTER — Ambulatory Visit (INDEPENDENT_AMBULATORY_CARE_PROVIDER_SITE_OTHER): Payer: Medicare Other | Admitting: Sports Medicine

## 2018-02-10 VITALS — BP 98/54 | HR 109 | Resp 16

## 2018-02-10 DIAGNOSIS — M79675 Pain in left toe(s): Secondary | ICD-10-CM

## 2018-02-10 DIAGNOSIS — M79674 Pain in right toe(s): Secondary | ICD-10-CM | POA: Diagnosis not present

## 2018-02-10 DIAGNOSIS — B351 Tinea unguium: Secondary | ICD-10-CM

## 2018-02-10 DIAGNOSIS — E1142 Type 2 diabetes mellitus with diabetic polyneuropathy: Secondary | ICD-10-CM | POA: Diagnosis not present

## 2018-02-10 NOTE — Progress Notes (Signed)
Subjective: Dustin Barton is a 63 y.o. male patient with history of diabetes who returns to office today for diabetic foot care. Patient reports that he does not remember his last A1c or blood sugar but still on Victoza. Does not recall last PCP visit but states that about 2 months ago was put on a new blood pressure pill. Patient denies any other pedal complaints or issues at this time.    Patient Active Problem List   Diagnosis Date Noted  . Severe obesity (BMI >= 40) ([place]) 10/16/2013  . Osteoarthritis   . Moderate persistent asthma with acute exacerbation   . Hypothyroidism   . Schizophrenia ([place])   . Hyperlipidemia   . Bipolar 1 disorder ([place])    Current Outpatient Medications on File Prior to Visit  Medication Sig Dispense Refill  . ACCU-CHEK FASTCLIX LANCETS MISC USE 1 DAILY  5  . albuterol (PROAIR HFA) 108 (90 BASE) MCG/ACT inhaler Inhale 2 puffs into the lungs every 4 (four) hours as needed for wheezing or shortness of breath. 1 Inhaler 6  . ARIPiprazole (ABILIFY) 20 MG tablet Take 20 mg by mouth daily.    Marland Kitchen aspirin (ASPIRIN 81) 81 MG EC tablet Take by mouth.    . BD PEN NEEDLE NANO U/F 32G X 4 MM MISC USE FOR DAILY INJECTION    . benztropine (COGENTIN) 1 MG tablet Take 1 mg by mouth 2 (two) times daily.    Marland Kitchen buPROPion (WELLBUTRIN SR) 150 MG 12 hr tablet TAKE ONE TABLET BY MOUTH EACH MORNING  0  . caffeine 200 MG TABS tablet Take one tablet as needed    . cyanocobalamin 2000 MCG tablet Take 2,000 mcg by mouth daily.    . divalproex (DEPAKOTE) 500 MG DR tablet Take 2 tablets by mouth. Take 1 tablet in morning and 2 tablets at night.    Marland Kitchen FARXIGA 10 MG TABS tablet Take 10 mg by mouth daily.  3  . fenofibrate micronized (LOFIBRA) 134 MG capsule Take 1 capsule by mouth daily.    Marland Kitchen ibuprofen (ADVIL,MOTRIN) 200 MG tablet Take 200 mg by mouth every 6 (six) hours as needed.    . Inositol Niacinate 100 MG TABS Take by mouth.    . levothyroxine (SYNTHROID, LEVOTHROID) 25 MCG tablet  Take 25 mcg by mouth daily before breakfast.    . Multiple Vitamin (MULTIVITAMIN) tablet Take 1 tablet by mouth daily.    . Multiple Vitamins-Minerals (MULTIVITAMIN WITH MINERALS) tablet Take one tablet at bedtime    . niacin 500 MG tablet Take by mouth.    . Omega-3 Fatty Acids (FISH OIL) 1200 MG CAPS Take 1 capsule by mouth 2 (two) times daily.    Marland Kitchen omeprazole (PRILOSEC) 20 MG capsule Take 1 capsule by mouth daily.    . pravastatin (PRAVACHOL) 10 MG tablet     . Probiotic CAPS Take 2 Capsules BID    . tamsulosin (FLOMAX) 0.4 MG CAPS capsule TAKE 1 CAPSULE BY MOUTH EVERY DAY AT BEDTIME  1  . VICTOZA 18 MG/3ML SOPN     . Wheat Dextrin (BENEFIBER DRINK MIX) PACK Take one tablespoon as needed     No current facility-administered medications on file prior to visit.    Allergies  Allergen Reactions  . Artane [Trihexyphenidyl]   . Haldol [Haloperidol]   . Trihexyphenidyl Hcl     No results found for this or any previous visit (from the past 2160 hour(s)).  Objective: General: Patient is awake, alert, and oriented x  3 and in no acute distress.  Integument: Skin is warm, dry and supple bilateral. Dry skin to heels. Nails are tender, mildly elongated, thickened and  dystrophic with subungual debris, consistent with onychomycosis, 1-5 bilateral. No signs of infection. No open lesions or preulcerative lesions present bilateral. Remaining integument unremarkable.  Vasculature:  Dorsalis Pedis pulse 2/4 bilateral. Posterior Tibial pulse  1/4 bilateral.  Capillary fill time <3 sec 1-5 bilateral. Positive hair growth to the level of the digits. Temperature gradient within normal limits. + varicosities present bilateral. No edema present bilateral.   Neurology: The patient has intact sensation measured with a 5.07/10g Semmes Weinstein Monofilament at all pedal sites bilateral . Vibratory sensation diminished bilateral with tuning fork. No Babinski sign present bilateral.   Musculoskeletal:  Asymptomatic limited ROM at 1st mtpjs, hammertoes and pes cavus pedal deformities noted bilateral. History of precious ankle sprains/ligament tears. Muscular strength 5/5 in all lower extremity muscular groups bilateral without pain on range of motion. No tenderness with calf compression bilateral.  Assessment and Plan: Problem List Items Addressed This Visit    None    Visit Diagnoses    Pain due to onychomycosis of toenails of both feet    -  Primary   Type 2 diabetes mellitus with diabetic polyneuropathy, unspecified whether long term insulin use (HCC)       Relevant Medications   caffeine 200 MG TABS tablet   aspirin (ASPIRIN 81) 81 MG EC tablet   pravastatin (PRAVACHOL) 10 MG tablet      -Examined patient. -Discussed and educated patient on diabetic foot care, especially with  regards to the vascular, neurological and musculoskeletal systems.  -Mechanically debrided all nails 1-5 bilateral using sterile nail nipper and filed with dremel without incident  -Continue with Diabetic shoes or boots that are comfortable that provide support to feet and ankles -Patient to return in 3 months for diabetic foot care -Patient advised to call the office if any problems or questions arise in the meantime.  Landis Martins, DPM

## 2018-03-02 DIAGNOSIS — R351 Nocturia: Secondary | ICD-10-CM | POA: Diagnosis not present

## 2018-03-02 DIAGNOSIS — R81 Glycosuria: Secondary | ICD-10-CM | POA: Diagnosis not present

## 2018-03-02 DIAGNOSIS — N401 Enlarged prostate with lower urinary tract symptoms: Secondary | ICD-10-CM | POA: Diagnosis not present

## 2018-03-06 DIAGNOSIS — E039 Hypothyroidism, unspecified: Secondary | ICD-10-CM | POA: Diagnosis not present

## 2018-03-06 DIAGNOSIS — Z79899 Other long term (current) drug therapy: Secondary | ICD-10-CM | POA: Diagnosis not present

## 2018-03-06 DIAGNOSIS — R7989 Other specified abnormal findings of blood chemistry: Secondary | ICD-10-CM | POA: Diagnosis not present

## 2018-03-06 DIAGNOSIS — E1140 Type 2 diabetes mellitus with diabetic neuropathy, unspecified: Secondary | ICD-10-CM | POA: Diagnosis not present

## 2018-03-06 DIAGNOSIS — J449 Chronic obstructive pulmonary disease, unspecified: Secondary | ICD-10-CM | POA: Diagnosis not present

## 2018-03-24 DIAGNOSIS — F250 Schizoaffective disorder, bipolar type: Secondary | ICD-10-CM | POA: Diagnosis not present

## 2018-04-26 DIAGNOSIS — E1140 Type 2 diabetes mellitus with diabetic neuropathy, unspecified: Secondary | ICD-10-CM | POA: Diagnosis not present

## 2018-04-26 DIAGNOSIS — Z713 Dietary counseling and surveillance: Secondary | ICD-10-CM | POA: Diagnosis not present

## 2018-05-12 ENCOUNTER — Encounter: Payer: Self-pay | Admitting: Sports Medicine

## 2018-05-12 ENCOUNTER — Other Ambulatory Visit: Payer: Self-pay

## 2018-05-12 ENCOUNTER — Ambulatory Visit (INDEPENDENT_AMBULATORY_CARE_PROVIDER_SITE_OTHER): Payer: Medicare Other | Admitting: Sports Medicine

## 2018-05-12 VITALS — Temp 98.4°F

## 2018-05-12 DIAGNOSIS — E1142 Type 2 diabetes mellitus with diabetic polyneuropathy: Secondary | ICD-10-CM

## 2018-05-12 DIAGNOSIS — M2022 Hallux rigidus, left foot: Secondary | ICD-10-CM

## 2018-05-12 DIAGNOSIS — M2021 Hallux rigidus, right foot: Secondary | ICD-10-CM

## 2018-05-12 DIAGNOSIS — M79674 Pain in right toe(s): Secondary | ICD-10-CM

## 2018-05-12 DIAGNOSIS — M79675 Pain in left toe(s): Secondary | ICD-10-CM | POA: Diagnosis not present

## 2018-05-12 DIAGNOSIS — B351 Tinea unguium: Secondary | ICD-10-CM

## 2018-05-12 DIAGNOSIS — M2042 Other hammer toe(s) (acquired), left foot: Secondary | ICD-10-CM

## 2018-05-12 DIAGNOSIS — M2041 Other hammer toe(s) (acquired), right foot: Secondary | ICD-10-CM

## 2018-05-12 NOTE — Progress Notes (Signed)
Subjective: Dustin Barton is a 63 y.o. male patient with history of diabetes who returns to office today for diabetic foot care. Patient reports that he does not remember his last A1c or blood sugar but still on Victoza and a was given a new cholesterol pill. Does not recall last PCP visit but states that he will go again in the next 3 months. Patient denies any other pedal complaints or issues at this time.    Patient Active Problem List   Diagnosis Date Noted  . Severe obesity (BMI >= 40) ([place]) 10/16/2013  . Osteoarthritis   . Moderate persistent asthma with acute exacerbation   . Hypothyroidism   . Schizophrenia ([place])   . Hyperlipidemia   . Bipolar 1 disorder ([place])    Current Outpatient Medications on File Prior to Visit  Medication Sig Dispense Refill  . ACCU-CHEK FASTCLIX LANCETS MISC USE 1 DAILY  5  . albuterol (PROAIR HFA) 108 (90 BASE) MCG/ACT inhaler Inhale 2 puffs into the lungs every 4 (four) hours as needed for wheezing or shortness of breath. 1 Inhaler 6  . ARIPiprazole (ABILIFY) 20 MG tablet Take 20 mg by mouth daily.    Marland Kitchen aspirin (ASPIRIN 81) 81 MG EC tablet Take by mouth.    . BD PEN NEEDLE NANO U/F 32G X 4 MM MISC USE FOR DAILY INJECTION    . benztropine (COGENTIN) 1 MG tablet Take 1 mg by mouth 2 (two) times daily.    Marland Kitchen buPROPion (WELLBUTRIN SR) 150 MG 12 hr tablet TAKE ONE TABLET BY MOUTH EACH MORNING  0  . caffeine 200 MG TABS tablet Take one tablet as needed    . cyanocobalamin 2000 MCG tablet Take 2,000 mcg by mouth daily.    . divalproex (DEPAKOTE) 500 MG DR tablet Take 2 tablets by mouth. Take 1 tablet in morning and 2 tablets at night.    Marland Kitchen FARXIGA 10 MG TABS tablet Take 10 mg by mouth daily.  3  . fenofibrate micronized (LOFIBRA) 134 MG capsule Take 1 capsule by mouth daily.    Marland Kitchen ibuprofen (ADVIL,MOTRIN) 200 MG tablet Take 200 mg by mouth every 6 (six) hours as needed.    . Inositol Niacinate 100 MG TABS Take by mouth.    . levothyroxine (SYNTHROID,  LEVOTHROID) 25 MCG tablet Take 25 mcg by mouth daily before breakfast.    . Multiple Vitamin (MULTIVITAMIN) tablet Take 1 tablet by mouth daily.    . Multiple Vitamins-Minerals (MULTIVITAMIN WITH MINERALS) tablet Take one tablet at bedtime    . niacin 500 MG tablet Take by mouth.    . Omega-3 Fatty Acids (FISH OIL) 1200 MG CAPS Take 1 capsule by mouth 2 (two) times daily.    Marland Kitchen omeprazole (PRILOSEC) 20 MG capsule Take 1 capsule by mouth daily.    . pravastatin (PRAVACHOL) 10 MG tablet     . Probiotic CAPS Take 2 Capsules BID    . tamsulosin (FLOMAX) 0.4 MG CAPS capsule TAKE 1 CAPSULE BY MOUTH EVERY DAY AT BEDTIME  1  . VICTOZA 18 MG/3ML SOPN     . Wheat Dextrin (BENEFIBER DRINK MIX) PACK Take one tablespoon as needed     No current facility-administered medications on file prior to visit.    Allergies  Allergen Reactions  . Artane [Trihexyphenidyl]   . Haldol [Haloperidol]   . Trihexyphenidyl Hcl     No results found for this or any previous visit (from the past 2160 hour(s)).  Objective: General: Patient is  awake, alert, and oriented x 3 and in no acute distress.  Integument: Skin is warm, dry and supple bilateral. Dry skin to heels. Nails are tender, mildly elongated, thickened and  dystrophic with subungual debris, consistent with onychomycosis, 1-5 bilateral. No signs of infection. No open lesions or preulcerative lesions present bilateral. Remaining integument unremarkable.  Vasculature:  Dorsalis Pedis pulse 2/4 bilateral. Posterior Tibial pulse  1/4 bilateral.  Capillary fill time <3 sec 1-5 bilateral. Positive hair growth to the level of the digits. Temperature gradient within normal limits. + varicosities present bilateral. No edema present bilateral.   Neurology: The patient has intact sensation measured with a 5.07/10g Semmes Weinstein Monofilament at all pedal sites bilateral . Vibratory sensation diminished bilateral with tuning fork. No Babinski sign present bilateral.    Musculoskeletal: Asymptomatic limited ROM at 1st mtpjs, hammertoes and pes cavus pedal deformities noted bilateral. History of precious ankle sprains/ligament tears. Muscular strength 5/5 in all lower extremity muscular groups bilateral without pain on range of motion. No tenderness with calf compression bilateral.  Assessment and Plan: Problem List Items Addressed This Visit    None    Visit Diagnoses    Pain due to onychomycosis of toenails of both feet    -  Primary   Type 2 diabetes mellitus with diabetic polyneuropathy, unspecified whether long term insulin use (HCC)       Hammer toes of both feet       Hallux rigidus of both feet          -Examined patient. -Discussed and educated patient on diabetic foot care, especially with  regards to the vascular, neurological and musculoskeletal systems.  -Mechanically debrided all nails 1-5 bilateral using sterile nail nipper and filed with dremel without incident  -Encouraged daily skin emollients as needed for dry skin areas especially at heels -Patient to return in 3 months for diabetic foot care -Patient advised to call the office if any problems or questions arise in the meantime.  Landis Martins, DPM

## 2018-05-18 DIAGNOSIS — E119 Type 2 diabetes mellitus without complications: Secondary | ICD-10-CM | POA: Diagnosis not present

## 2018-05-23 DIAGNOSIS — Z0000 Encounter for general adult medical examination without abnormal findings: Secondary | ICD-10-CM | POA: Diagnosis not present

## 2018-05-23 DIAGNOSIS — Z125 Encounter for screening for malignant neoplasm of prostate: Secondary | ICD-10-CM | POA: Diagnosis not present

## 2018-05-23 DIAGNOSIS — Z1339 Encounter for screening examination for other mental health and behavioral disorders: Secondary | ICD-10-CM | POA: Diagnosis not present

## 2018-05-23 DIAGNOSIS — Z9181 History of falling: Secondary | ICD-10-CM | POA: Diagnosis not present

## 2018-05-23 DIAGNOSIS — Z6841 Body Mass Index (BMI) 40.0 and over, adult: Secondary | ICD-10-CM | POA: Diagnosis not present

## 2018-05-23 DIAGNOSIS — E785 Hyperlipidemia, unspecified: Secondary | ICD-10-CM | POA: Diagnosis not present

## 2018-07-03 DIAGNOSIS — N401 Enlarged prostate with lower urinary tract symptoms: Secondary | ICD-10-CM | POA: Diagnosis not present

## 2018-07-03 DIAGNOSIS — R351 Nocturia: Secondary | ICD-10-CM | POA: Diagnosis not present

## 2018-07-17 DIAGNOSIS — E039 Hypothyroidism, unspecified: Secondary | ICD-10-CM | POA: Diagnosis not present

## 2018-07-17 DIAGNOSIS — Z79899 Other long term (current) drug therapy: Secondary | ICD-10-CM | POA: Diagnosis not present

## 2018-07-17 DIAGNOSIS — E1140 Type 2 diabetes mellitus with diabetic neuropathy, unspecified: Secondary | ICD-10-CM | POA: Diagnosis not present

## 2018-07-17 DIAGNOSIS — J449 Chronic obstructive pulmonary disease, unspecified: Secondary | ICD-10-CM | POA: Diagnosis not present

## 2018-07-17 DIAGNOSIS — E785 Hyperlipidemia, unspecified: Secondary | ICD-10-CM | POA: Diagnosis not present

## 2018-08-11 ENCOUNTER — Other Ambulatory Visit: Payer: Self-pay

## 2018-08-11 ENCOUNTER — Ambulatory Visit (INDEPENDENT_AMBULATORY_CARE_PROVIDER_SITE_OTHER): Payer: Medicare Other | Admitting: Sports Medicine

## 2018-08-11 ENCOUNTER — Encounter: Payer: Self-pay | Admitting: Sports Medicine

## 2018-08-11 DIAGNOSIS — E1142 Type 2 diabetes mellitus with diabetic polyneuropathy: Secondary | ICD-10-CM | POA: Diagnosis not present

## 2018-08-11 DIAGNOSIS — M79674 Pain in right toe(s): Secondary | ICD-10-CM

## 2018-08-11 DIAGNOSIS — B351 Tinea unguium: Secondary | ICD-10-CM

## 2018-08-11 DIAGNOSIS — M79675 Pain in left toe(s): Secondary | ICD-10-CM

## 2018-08-11 NOTE — Progress Notes (Signed)
Subjective: Dustin Barton is a 63 y.o. male patient with history of diabetes who returns to office today for diabetic foot care. Patient reports that last blood sugar was 191 on yesterday. States that he is doing good and is in a counseling program. Reports that he is learning to listen and wait his turn to speak; Patient denies any other pedal complaints or issues at this time.    Patient Active Problem List   Diagnosis Date Noted  . Severe obesity (BMI >= 40) ([place]) 10/16/2013  . Osteoarthritis   . Moderate persistent asthma with acute exacerbation   . Hypothyroidism   . Schizophrenia ([place])   . Hyperlipidemia   . Bipolar 1 disorder ([place])    Current Outpatient Medications on File Prior to Visit  Medication Sig Dispense Refill  . ACCU-CHEK FASTCLIX LANCETS MISC USE 1 DAILY  5  . albuterol (PROAIR HFA) 108 (90 BASE) MCG/ACT inhaler Inhale 2 puffs into the lungs every 4 (four) hours as needed for wheezing or shortness of breath. 1 Inhaler 6  . ARIPiprazole (ABILIFY) 20 MG tablet Take 20 mg by mouth daily.    Marland Kitchen aspirin (ASPIRIN 81) 81 MG EC tablet Take by mouth.    . BD PEN NEEDLE NANO U/F 32G X 4 MM MISC USE FOR DAILY INJECTION    . benztropine (COGENTIN) 1 MG tablet Take 1 mg by mouth 2 (two) times daily.    Marland Kitchen buPROPion (WELLBUTRIN SR) 150 MG 12 hr tablet TAKE ONE TABLET BY MOUTH EACH MORNING  0  . caffeine 200 MG TABS tablet Take one tablet as needed    . cyanocobalamin 2000 MCG tablet Take 2,000 mcg by mouth daily.    . divalproex (DEPAKOTE) 500 MG DR tablet Take 2 tablets by mouth. Take 1 tablet in morning and 2 tablets at night.    Marland Kitchen FARXIGA 10 MG TABS tablet Take 10 mg by mouth daily.  3  . fenofibrate micronized (LOFIBRA) 134 MG capsule Take 1 capsule by mouth daily.    Marland Kitchen ibuprofen (ADVIL,MOTRIN) 200 MG tablet Take 200 mg by mouth every 6 (six) hours as needed.    . Inositol Niacinate 100 MG TABS Take by mouth.    . levothyroxine (SYNTHROID, LEVOTHROID) 25 MCG tablet Take 25 mcg  by mouth daily before breakfast.    . Multiple Vitamin (MULTIVITAMIN) tablet Take 1 tablet by mouth daily.    . Multiple Vitamins-Minerals (MULTIVITAMIN WITH MINERALS) tablet Take one tablet at bedtime    . niacin 500 MG tablet Take by mouth.    . Omega-3 Fatty Acids (FISH OIL) 1200 MG CAPS Take 1 capsule by mouth 2 (two) times daily.    Marland Kitchen omeprazole (PRILOSEC) 20 MG capsule Take 1 capsule by mouth daily.    . pravastatin (PRAVACHOL) 10 MG tablet     . Probiotic CAPS Take 2 Capsules BID    . tamsulosin (FLOMAX) 0.4 MG CAPS capsule TAKE 1 CAPSULE BY MOUTH EVERY DAY AT BEDTIME  1  . VICTOZA 18 MG/3ML SOPN     . Wheat Dextrin (BENEFIBER DRINK MIX) PACK Take one tablespoon as needed     No current facility-administered medications on file prior to visit.    Allergies  Allergen Reactions  . Artane [Trihexyphenidyl]   . Haldol [Haloperidol]   . Trihexyphenidyl Hcl     No results found for this or any previous visit (from the past 2160 hour(s)).  Objective: General: Patient is awake, alert, and oriented x 3 and in  no acute distress.  Integument: Skin is warm, dry and supple bilateral. Dry skin to heels. Nails are tender, mildly elongated, thickened and dystrophic with subungual debris, consistent with onychomycosis, 1-5 bilateral. No signs of infection. No open lesions or preulcerative lesions present bilateral. Remaining integument unremarkable.  Vasculature:  Dorsalis Pedis pulse 2/4 bilateral. Posterior Tibial pulse  1/4 bilateral.  Capillary fill time <3 sec 1-5 bilateral. Positive hair growth to the level of the digits. Temperature gradient within normal limits. + varicosities present bilateral. No edema present bilateral.   Neurology: The patient has intact sensation measured with a 5.07/10g Semmes Weinstein Monofilament at all pedal sites bilateral . Vibratory sensation diminished bilateral with tuning fork. No Babinski sign present bilateral.   Musculoskeletal: Asymptomatic limited  ROM at 1st mtpjs, hammertoes and pes cavus pedal deformities noted bilateral. History of precious ankle sprains/ligament tears. Muscular strength 5/5 in all lower extremity muscular groups bilateral without pain on range of motion. No tenderness with calf compression bilateral.  Assessment and Plan: Problem List Items Addressed This Visit    None    Visit Diagnoses    Pain due to onychomycosis of toenails of both feet    -  Primary   Type 2 diabetes mellitus with diabetic polyneuropathy, unspecified whether long term insulin use ([place])          -Examined patient. -Re-Discussed and educated patient on diabetic foot care, especially with  regards to the vascular, neurological and musculoskeletal systems.  -Mechanically debrided all nails 1-5 bilateral using sterile nail nipper and filed with dremel without incident  -Encouraged daily skin emollients as needed for dry skin areas especially at heels like before -Patient to return in 3 months for diabetic foot care -Patient advised to call the office if any problems or questions arise in the meantime.  Landis Martins, DPM

## 2018-09-08 DIAGNOSIS — F250 Schizoaffective disorder, bipolar type: Secondary | ICD-10-CM | POA: Diagnosis not present

## 2018-09-13 DIAGNOSIS — Z23 Encounter for immunization: Secondary | ICD-10-CM | POA: Diagnosis not present

## 2018-09-13 DIAGNOSIS — Z6841 Body Mass Index (BMI) 40.0 and over, adult: Secondary | ICD-10-CM | POA: Diagnosis not present

## 2018-09-13 DIAGNOSIS — Z87891 Personal history of nicotine dependence: Secondary | ICD-10-CM | POA: Diagnosis not present

## 2018-09-13 DIAGNOSIS — R197 Diarrhea, unspecified: Secondary | ICD-10-CM | POA: Diagnosis not present

## 2018-10-12 DIAGNOSIS — F250 Schizoaffective disorder, bipolar type: Secondary | ICD-10-CM | POA: Diagnosis not present

## 2018-11-02 DIAGNOSIS — N401 Enlarged prostate with lower urinary tract symptoms: Secondary | ICD-10-CM | POA: Diagnosis not present

## 2018-11-02 DIAGNOSIS — R339 Retention of urine, unspecified: Secondary | ICD-10-CM | POA: Diagnosis not present

## 2018-11-10 ENCOUNTER — Other Ambulatory Visit: Payer: Self-pay

## 2018-11-10 ENCOUNTER — Ambulatory Visit (INDEPENDENT_AMBULATORY_CARE_PROVIDER_SITE_OTHER): Payer: Medicare Other | Admitting: Sports Medicine

## 2018-11-10 ENCOUNTER — Encounter: Payer: Self-pay | Admitting: Sports Medicine

## 2018-11-10 DIAGNOSIS — M2042 Other hammer toe(s) (acquired), left foot: Secondary | ICD-10-CM

## 2018-11-10 DIAGNOSIS — B351 Tinea unguium: Secondary | ICD-10-CM | POA: Diagnosis not present

## 2018-11-10 DIAGNOSIS — M79675 Pain in left toe(s): Secondary | ICD-10-CM | POA: Diagnosis not present

## 2018-11-10 DIAGNOSIS — M79674 Pain in right toe(s): Secondary | ICD-10-CM | POA: Diagnosis not present

## 2018-11-10 DIAGNOSIS — M216X9 Other acquired deformities of unspecified foot: Secondary | ICD-10-CM

## 2018-11-10 DIAGNOSIS — M2041 Other hammer toe(s) (acquired), right foot: Secondary | ICD-10-CM

## 2018-11-10 DIAGNOSIS — E1142 Type 2 diabetes mellitus with diabetic polyneuropathy: Secondary | ICD-10-CM

## 2018-11-10 DIAGNOSIS — M2022 Hallux rigidus, left foot: Secondary | ICD-10-CM

## 2018-11-10 DIAGNOSIS — M2021 Hallux rigidus, right foot: Secondary | ICD-10-CM

## 2018-11-10 NOTE — Progress Notes (Signed)
Subjective: Sollie Tippery is a 63 y.o. male patient with history of diabetes who returns to office today for diabetic foot care. Patient reports that last blood sugar does not remember and does not remember last A1c reports that he saw his PCP over 3 months ago denies any other pedal complaints or issues.  Patient also reports that he desires to have shoes.  No other issues noted.   Patient Active Problem List   Diagnosis Date Noted  . Severe obesity (BMI >= 40) ([place]) 10/16/2013  . Osteoarthritis   . Moderate persistent asthma with acute exacerbation   . Hypothyroidism   . Schizophrenia ([place])   . Hyperlipidemia   . Bipolar 1 disorder ([place])    Current Outpatient Medications on File Prior to Visit  Medication Sig Dispense Refill  . ACCU-CHEK FASTCLIX LANCETS MISC USE 1 DAILY  5  . albuterol (PROAIR HFA) 108 (90 BASE) MCG/ACT inhaler Inhale 2 puffs into the lungs every 4 (four) hours as needed for wheezing or shortness of breath. 1 Inhaler 6  . ARIPiprazole (ABILIFY) 20 MG tablet Take 20 mg by mouth daily.    Marland Kitchen aspirin (ASPIRIN 81) 81 MG EC tablet Take by mouth.    . BD PEN NEEDLE NANO U/F 32G X 4 MM MISC USE FOR DAILY INJECTION    . benztropine (COGENTIN) 1 MG tablet Take 1 mg by mouth 2 (two) times daily.    Marland Kitchen buPROPion (WELLBUTRIN SR) 150 MG 12 hr tablet TAKE ONE TABLET BY MOUTH EACH MORNING  0  . caffeine 200 MG TABS tablet Take one tablet as needed    . cyanocobalamin 2000 MCG tablet Take 2,000 mcg by mouth daily.    . divalproex (DEPAKOTE) 500 MG DR tablet Take 2 tablets by mouth. Take 1 tablet in morning and 2 tablets at night.    Marland Kitchen FARXIGA 10 MG TABS tablet Take 10 mg by mouth daily.  3  . fenofibrate micronized (LOFIBRA) 134 MG capsule Take 1 capsule by mouth daily.    Marland Kitchen ibuprofen (ADVIL,MOTRIN) 200 MG tablet Take 200 mg by mouth every 6 (six) hours as needed.    . Inositol Niacinate 100 MG TABS Take by mouth.    . levothyroxine (SYNTHROID, LEVOTHROID) 25 MCG tablet Take 25 mcg  by mouth daily before breakfast.    . Multiple Vitamin (MULTIVITAMIN) tablet Take 1 tablet by mouth daily.    . Multiple Vitamins-Minerals (MULTIVITAMIN WITH MINERALS) tablet Take one tablet at bedtime    . niacin 500 MG tablet Take by mouth.    . Omega-3 Fatty Acids (FISH OIL) 1200 MG CAPS Take 1 capsule by mouth 2 (two) times daily.    Marland Kitchen omeprazole (PRILOSEC) 20 MG capsule Take 1 capsule by mouth daily.    . pravastatin (PRAVACHOL) 10 MG tablet     . Probiotic CAPS Take 2 Capsules BID    . tamsulosin (FLOMAX) 0.4 MG CAPS capsule TAKE 1 CAPSULE BY MOUTH EVERY DAY AT BEDTIME  1  . VICTOZA 18 MG/3ML SOPN     . Wheat Dextrin (BENEFIBER DRINK MIX) PACK Take one tablespoon as needed     No current facility-administered medications on file prior to visit.    Allergies  Allergen Reactions  . Artane [Trihexyphenidyl]   . Haldol [Haloperidol]   . Trihexyphenidyl Hcl     No results found for this or any previous visit (from the past 2160 hour(s)).  Objective: General: Patient is awake, alert, and oriented x 3 and in  no acute distress.  Integument: Skin is warm, dry and supple bilateral. Dry skin to heels. Nails are tender, mildly elongated, thickened and dystrophic with subungual debris, consistent with onychomycosis, 1-5 bilateral. No signs of infection. No open lesions or preulcerative lesions present bilateral. Remaining integument unremarkable.  Vasculature:  Dorsalis Pedis pulse 2/4 bilateral. Posterior Tibial pulse  1/4 bilateral. Capillary fill time <3 sec 1-5 bilateral. Positive hair growth to the level of the digits.Temperature gradient within normal limits. + varicosities present bilateral. No edema present bilateral.   Neurology: The patient has intact sensation measured with a 5.07/10g Semmes Weinstein Monofilament at all pedal sites bilateral . Vibratory sensation diminished bilateral with tuning fork. No Babinski sign present bilateral.   Musculoskeletal: Asymptomatic limited ROM  at 1st mtpjs, hammertoes and pes cavus pedal deformities noted bilateral. History of precious ankle sprains/ligament tears. Muscular strength 5/5 in all lower extremity muscular groups bilateral without pain on range of motion. No tenderness with calf compression bilateral.  Assessment and Plan: Problem List Items Addressed This Visit    None    Visit Diagnoses    Pain due to onychomycosis of toenails of both feet    -  Primary   Type 2 diabetes mellitus with diabetic polyneuropathy, unspecified whether long term insulin use (HCC)       Hammer toes of both feet       Hallux rigidus of both feet       Cavus deformity of foot, acquired          -Examined patient. -Re-Discussed and educated patient on diabetic foot care, especially with regards to the vascular, neurological and musculoskeletal systems.  -Mechanically debrided all nails 1-5 bilateral using sterile nail nipper and filed with dremel without incident  -Office will call patient re: Diabetic shoes -Encouraged daily skin emollients as needed for dry skin areas especially at heels like before -Patient to return in 3 months for diabetic foot care -Patient advised to call the office if any problems or questions arise in the meantime.  Landis Martins, DPM

## 2018-11-20 DIAGNOSIS — E785 Hyperlipidemia, unspecified: Secondary | ICD-10-CM | POA: Diagnosis not present

## 2018-11-20 DIAGNOSIS — F209 Schizophrenia, unspecified: Secondary | ICD-10-CM | POA: Diagnosis not present

## 2018-11-20 DIAGNOSIS — E039 Hypothyroidism, unspecified: Secondary | ICD-10-CM | POA: Diagnosis not present

## 2018-11-20 DIAGNOSIS — J449 Chronic obstructive pulmonary disease, unspecified: Secondary | ICD-10-CM | POA: Diagnosis not present

## 2018-12-08 ENCOUNTER — Ambulatory Visit: Payer: Medicare Other | Admitting: Orthotics

## 2018-12-08 ENCOUNTER — Other Ambulatory Visit: Payer: Self-pay

## 2018-12-08 DIAGNOSIS — E1142 Type 2 diabetes mellitus with diabetic polyneuropathy: Secondary | ICD-10-CM

## 2018-12-08 DIAGNOSIS — M2041 Other hammer toe(s) (acquired), right foot: Secondary | ICD-10-CM

## 2018-12-08 NOTE — Progress Notes (Signed)

## 2018-12-13 DIAGNOSIS — F250 Schizoaffective disorder, bipolar type: Secondary | ICD-10-CM | POA: Diagnosis not present

## 2019-01-17 DIAGNOSIS — H26491 Other secondary cataract, right eye: Secondary | ICD-10-CM | POA: Diagnosis not present

## 2019-01-17 DIAGNOSIS — H5203 Hypermetropia, bilateral: Secondary | ICD-10-CM | POA: Diagnosis not present

## 2019-01-17 DIAGNOSIS — H524 Presbyopia: Secondary | ICD-10-CM | POA: Diagnosis not present

## 2019-01-17 DIAGNOSIS — Z7984 Long term (current) use of oral hypoglycemic drugs: Secondary | ICD-10-CM | POA: Diagnosis not present

## 2019-01-17 DIAGNOSIS — H25812 Combined forms of age-related cataract, left eye: Secondary | ICD-10-CM | POA: Diagnosis not present

## 2019-01-17 DIAGNOSIS — E119 Type 2 diabetes mellitus without complications: Secondary | ICD-10-CM | POA: Diagnosis not present

## 2019-01-17 DIAGNOSIS — I10 Essential (primary) hypertension: Secondary | ICD-10-CM | POA: Diagnosis not present

## 2019-01-17 DIAGNOSIS — H52223 Regular astigmatism, bilateral: Secondary | ICD-10-CM | POA: Diagnosis not present

## 2019-02-02 DIAGNOSIS — R339 Retention of urine, unspecified: Secondary | ICD-10-CM | POA: Diagnosis not present

## 2019-02-02 DIAGNOSIS — N401 Enlarged prostate with lower urinary tract symptoms: Secondary | ICD-10-CM | POA: Diagnosis not present

## 2019-02-08 ENCOUNTER — Other Ambulatory Visit: Payer: Self-pay

## 2019-02-08 ENCOUNTER — Encounter: Payer: Self-pay | Admitting: Sports Medicine

## 2019-02-08 ENCOUNTER — Ambulatory Visit (INDEPENDENT_AMBULATORY_CARE_PROVIDER_SITE_OTHER): Payer: Medicare Other | Admitting: Sports Medicine

## 2019-02-08 DIAGNOSIS — M79674 Pain in right toe(s): Secondary | ICD-10-CM | POA: Diagnosis not present

## 2019-02-08 DIAGNOSIS — B351 Tinea unguium: Secondary | ICD-10-CM

## 2019-02-08 DIAGNOSIS — M79675 Pain in left toe(s): Secondary | ICD-10-CM | POA: Diagnosis not present

## 2019-02-08 DIAGNOSIS — E1142 Type 2 diabetes mellitus with diabetic polyneuropathy: Secondary | ICD-10-CM | POA: Diagnosis not present

## 2019-02-08 NOTE — Progress Notes (Signed)
Subjective: Dustin Barton is a 64 y.o. male patient with history of diabetes who returns to office today for diabetic foot care. Patient reports that last blood sugar was 200 and does not remember last A1c reports that he saw his PCP over 3 months ago denies any other pedal complaints or issues. Patient also wants to know if his shoes are in? No other issues noted.  Reports that he stopped smoking 5 weeks ago.   Patient Active Problem List   Diagnosis Date Noted  . Severe obesity (BMI >= 40) ([place]) 10/16/2013  . Osteoarthritis   . Moderate persistent asthma with acute exacerbation   . Hypothyroidism   . Schizophrenia ([place])   . Hyperlipidemia   . Bipolar 1 disorder ([place])    Current Outpatient Medications on File Prior to Visit  Medication Sig Dispense Refill  . ACCU-CHEK FASTCLIX LANCETS MISC USE 1 DAILY  5  . albuterol (PROAIR HFA) 108 (90 BASE) MCG/ACT inhaler Inhale 2 puffs into the lungs every 4 (four) hours as needed for wheezing or shortness of breath. 1 Inhaler 6  . ARIPiprazole (ABILIFY) 20 MG tablet Take 20 mg by mouth daily.    Marland Kitchen aspirin (ASPIRIN 81) 81 MG EC tablet Take by mouth.    . BD PEN NEEDLE NANO U/F 32G X 4 MM MISC USE FOR DAILY INJECTION    . benztropine (COGENTIN) 1 MG tablet Take 1 mg by mouth 2 (two) times daily.    Marland Kitchen buPROPion (WELLBUTRIN SR) 150 MG 12 hr tablet TAKE ONE TABLET BY MOUTH EACH MORNING  0  . divalproex (DEPAKOTE) 500 MG DR tablet Take 2 tablets by mouth. Take 1 tablet in morning and 2 tablets at night.    Marland Kitchen FARXIGA 10 MG TABS tablet Take 10 mg by mouth daily.  3  . fenofibrate micronized (LOFIBRA) 134 MG capsule Take 1 capsule by mouth daily.    Marland Kitchen levothyroxine (SYNTHROID, LEVOTHROID) 25 MCG tablet Take 25 mcg by mouth daily before breakfast.    . Multiple Vitamin (MULTIVITAMIN) tablet Take 1 tablet by mouth daily.    . Multiple Vitamins-Minerals (MULTIVITAMIN WITH MINERALS) tablet Take one tablet at bedtime    . Omega-3 Fatty Acids (FISH OIL) 1200  MG CAPS Take 1 capsule by mouth 2 (two) times daily.    Marland Kitchen omeprazole (PRILOSEC) 20 MG capsule Take 1 capsule by mouth daily.    . pravastatin (PRAVACHOL) 10 MG tablet     . tamsulosin (FLOMAX) 0.4 MG CAPS capsule TAKE 1 CAPSULE BY MOUTH EVERY DAY AT BEDTIME  1  . VICTOZA 18 MG/3ML SOPN      No current facility-administered medications on file prior to visit.   Allergies  Allergen Reactions  . Artane [Trihexyphenidyl]   . Haldol [Haloperidol]   . Trihexyphenidyl Hcl     No results found for this or any previous visit (from the past 2160 hour(s)).  Objective: General: Patient is awake, alert, and oriented x 3 and in no acute distress.  Integument: Skin is warm, dry and supple bilateral. + dry skin to heels. Nails are tender, mildly elongated, thickened and dystrophic with subungual debris, consistent with onychomycosis, 1-5 bilateral. No signs of infection. No open lesions or preulcerative lesions present bilateral. Remaining integument unremarkable.  Vasculature:  Dorsalis Pedis pulse 2/4 bilateral. Posterior Tibial pulse  1/4 bilateral. Capillary fill time <3 sec 1-5 bilateral. Positive hair growth to the level of the digits.Temperature gradient within normal limits. + varicosities present bilateral. No edema present bilateral.  Neurology: The patient has intact sensation measured with a 5.07/10g Semmes Weinstein Monofilament at all pedal sites bilateral . Vibratory sensation diminished bilateral with tuning fork. No Babinski sign present bilateral.   Musculoskeletal: Asymptomatic limited ROM at 1st mtpjs, hammertoes and pes cavus pedal deformities noted bilateral. History of precious ankle sprains/ligament tears. Muscular strength 5/5 in all lower extremity muscular groups bilateral without pain on range of motion. No tenderness with calf compression bilateral.  Assessment and Plan: Problem List Items Addressed This Visit    None    Visit Diagnoses    Pain due to onychomycosis of  toenails of both feet    -  Primary   Type 2 diabetes mellitus with diabetic polyneuropathy, unspecified whether long term insulin use ([place])         -Examined patient. -Discussed and educated patient on diabetic foot care, especially with regards to the vascular, neurological and musculoskeletal systems.  -Mechanically debrided all nails 1-5 bilateral using sterile nail nipper and filed with dremel without incident  -Encouraged daily skin emollients for dry heels  -Awaiting diabetic shoes -Patient to return in 3 months for diabetic foot care -Patient advised to call the office if any problems or questions arise in the meantime.  Landis Martins, DPM

## 2019-03-19 ENCOUNTER — Ambulatory Visit (INDEPENDENT_AMBULATORY_CARE_PROVIDER_SITE_OTHER): Payer: Medicare HMO | Admitting: Orthotics

## 2019-03-19 ENCOUNTER — Other Ambulatory Visit: Payer: Self-pay

## 2019-03-19 DIAGNOSIS — M2042 Other hammer toe(s) (acquired), left foot: Secondary | ICD-10-CM

## 2019-03-19 DIAGNOSIS — M216X9 Other acquired deformities of unspecified foot: Secondary | ICD-10-CM

## 2019-03-19 DIAGNOSIS — E1142 Type 2 diabetes mellitus with diabetic polyneuropathy: Secondary | ICD-10-CM

## 2019-03-19 DIAGNOSIS — M2022 Hallux rigidus, left foot: Secondary | ICD-10-CM | POA: Diagnosis not present

## 2019-03-19 DIAGNOSIS — M2041 Other hammer toe(s) (acquired), right foot: Secondary | ICD-10-CM

## 2019-03-19 DIAGNOSIS — M2021 Hallux rigidus, right foot: Secondary | ICD-10-CM

## 2019-03-19 NOTE — Progress Notes (Signed)

## 2019-03-23 DIAGNOSIS — R69 Illness, unspecified: Secondary | ICD-10-CM | POA: Diagnosis not present

## 2019-04-17 DIAGNOSIS — R69 Illness, unspecified: Secondary | ICD-10-CM | POA: Diagnosis not present

## 2019-04-24 DIAGNOSIS — K529 Noninfective gastroenteritis and colitis, unspecified: Secondary | ICD-10-CM | POA: Diagnosis not present

## 2019-04-24 DIAGNOSIS — Z6841 Body Mass Index (BMI) 40.0 and over, adult: Secondary | ICD-10-CM | POA: Diagnosis not present

## 2019-04-24 DIAGNOSIS — R69 Illness, unspecified: Secondary | ICD-10-CM | POA: Diagnosis not present

## 2019-05-01 DIAGNOSIS — R69 Illness, unspecified: Secondary | ICD-10-CM | POA: Diagnosis not present

## 2019-05-10 ENCOUNTER — Other Ambulatory Visit: Payer: Self-pay

## 2019-05-10 ENCOUNTER — Ambulatory Visit (INDEPENDENT_AMBULATORY_CARE_PROVIDER_SITE_OTHER): Payer: Medicare HMO | Admitting: Sports Medicine

## 2019-05-10 ENCOUNTER — Encounter: Payer: Self-pay | Admitting: Sports Medicine

## 2019-05-10 DIAGNOSIS — M79675 Pain in left toe(s): Secondary | ICD-10-CM

## 2019-05-10 DIAGNOSIS — E1142 Type 2 diabetes mellitus with diabetic polyneuropathy: Secondary | ICD-10-CM

## 2019-05-10 DIAGNOSIS — M79674 Pain in right toe(s): Secondary | ICD-10-CM

## 2019-05-10 DIAGNOSIS — B351 Tinea unguium: Secondary | ICD-10-CM

## 2019-05-10 NOTE — Progress Notes (Signed)
Subjective: Dustin Barton is a 64 y.o. male patient with history of diabetes who returns to office today for diabetic foot care. Patient reports that last blood sugar was 200 and does not remember last A1c reports that he saw his PCP over 3 months ago denies any other pedal complaints or issues. No other issues noted.  Patient Active Problem List   Diagnosis Date Noted  . Severe obesity (BMI >= 40) ([place]) 10/16/2013  . Osteoarthritis   . Moderate persistent asthma with acute exacerbation   . Hypothyroidism   . Schizophrenia ([place])   . Hyperlipidemia   . Bipolar 1 disorder ([place])    Current Outpatient Medications on File Prior to Visit  Medication Sig Dispense Refill  . ACCU-CHEK FASTCLIX LANCETS MISC USE 1 DAILY  5  . albuterol (PROAIR HFA) 108 (90 BASE) MCG/ACT inhaler Inhale 2 puffs into the lungs every 4 (four) hours as needed for wheezing or shortness of breath. 1 Inhaler 6  . ARIPiprazole (ABILIFY) 20 MG tablet Take 20 mg by mouth daily.    Marland Kitchen aspirin (ASPIRIN 81) 81 MG EC tablet Take by mouth.    . BD PEN NEEDLE NANO U/F 32G X 4 MM MISC USE FOR DAILY INJECTION    . benztropine (COGENTIN) 1 MG tablet Take 1 mg by mouth 2 (two) times daily.    Marland Kitchen buPROPion (WELLBUTRIN SR) 150 MG 12 hr tablet TAKE ONE TABLET BY MOUTH EACH MORNING  0  . divalproex (DEPAKOTE) 500 MG DR tablet Take 2 tablets by mouth. Take 1 tablet in morning and 2 tablets at night.    Marland Kitchen FARXIGA 10 MG TABS tablet Take 10 mg by mouth daily.  3  . fenofibrate micronized (LOFIBRA) 134 MG capsule Take 1 capsule by mouth daily.    Marland Kitchen levothyroxine (SYNTHROID, LEVOTHROID) 25 MCG tablet Take 25 mcg by mouth daily before breakfast.    . Multiple Vitamin (MULTIVITAMIN) tablet Take 1 tablet by mouth daily.    . Multiple Vitamins-Minerals (MULTIVITAMIN WITH MINERALS) tablet Take one tablet at bedtime    . Omega-3 Fatty Acids (FISH OIL) 1200 MG CAPS Take 1 capsule by mouth 2 (two) times daily.    Marland Kitchen omeprazole (PRILOSEC) 20 MG capsule  Take 1 capsule by mouth daily.    . pravastatin (PRAVACHOL) 10 MG tablet     . tamsulosin (FLOMAX) 0.4 MG CAPS capsule TAKE 1 CAPSULE BY MOUTH EVERY DAY AT BEDTIME  1  . VICTOZA 18 MG/3ML SOPN      No current facility-administered medications on file prior to visit.   Allergies  Allergen Reactions  . Artane [Trihexyphenidyl]   . Haldol [Haloperidol]   . Trihexyphenidyl Hcl     No results found for this or any previous visit (from the past 2160 hour(s)).  Objective: General: Patient is awake, alert, and oriented x 3 and in no acute distress.  Integument: Skin is warm, dry and supple bilateral. ++dry skin to heels. Nails are tender, mildly elongated, thickened and dystrophic with subungual debris, consistent with onychomycosis, 1-5 bilateral. No signs of infection. No open lesions or preulcerative lesions present bilateral. Remaining integument unremarkable.  Vasculature:  Dorsalis Pedis pulse 2/4 bilateral. Posterior Tibial pulse  1/4 bilateral. Capillary fill time <3 sec 1-5 bilateral. Positive hair growth to the level of the digits.Temperature gradient within normal limits. + varicosities present bilateral. No edema present bilateral.   Neurology: The patient has intact sensation measured with a 5.07/10g Semmes Weinstein Monofilament at all pedal sites bilateral .  Vibratory sensation diminished bilateral with tuning fork. No Babinski sign present bilateral.   Musculoskeletal: Asymptomatic limited ROM at 1st mtpjs, hammertoes and pes cavus pedal deformities noted bilateral. History of precious ankle sprains/ligament tears. Muscular strength 5/5 in all lower extremity muscular groups bilateral without pain on range of motion. No tenderness with calf compression bilateral.  Assessment and Plan: Problem List Items Addressed This Visit    None    Visit Diagnoses    Pain due to onychomycosis of toenails of both feet    -  Primary   Type 2 diabetes mellitus with diabetic polyneuropathy,  unspecified whether long term insulin use ([place])         -Examined patient. -Re-Discussed and educated patient on diabetic foot care, especially with regards to the vascular, neurological and musculoskeletal systems.  -Mechanically debrided all nails 1-5 bilateral using sterile nail nipper and filed with dremel without incident  -Continue with diabetic shoes -Patient to return in 3 months for diabetic foot care -Patient advised to call the office if any problems or questions arise in the meantime.  Landis Martins, DPM

## 2019-05-15 DIAGNOSIS — R69 Illness, unspecified: Secondary | ICD-10-CM | POA: Diagnosis not present

## 2019-05-18 DIAGNOSIS — R69 Illness, unspecified: Secondary | ICD-10-CM | POA: Diagnosis not present

## 2019-05-22 DIAGNOSIS — Z79899 Other long term (current) drug therapy: Secondary | ICD-10-CM | POA: Diagnosis not present

## 2019-05-25 DIAGNOSIS — Z1331 Encounter for screening for depression: Secondary | ICD-10-CM | POA: Diagnosis not present

## 2019-05-25 DIAGNOSIS — Z125 Encounter for screening for malignant neoplasm of prostate: Secondary | ICD-10-CM | POA: Diagnosis not present

## 2019-05-25 DIAGNOSIS — Z6841 Body Mass Index (BMI) 40.0 and over, adult: Secondary | ICD-10-CM | POA: Diagnosis not present

## 2019-05-25 DIAGNOSIS — Z9181 History of falling: Secondary | ICD-10-CM | POA: Diagnosis not present

## 2019-05-25 DIAGNOSIS — Z0000 Encounter for general adult medical examination without abnormal findings: Secondary | ICD-10-CM | POA: Diagnosis not present

## 2019-05-25 DIAGNOSIS — E785 Hyperlipidemia, unspecified: Secondary | ICD-10-CM | POA: Diagnosis not present

## 2019-05-29 DIAGNOSIS — R69 Illness, unspecified: Secondary | ICD-10-CM | POA: Diagnosis not present

## 2019-06-05 DIAGNOSIS — R69 Illness, unspecified: Secondary | ICD-10-CM | POA: Diagnosis not present

## 2019-06-18 DIAGNOSIS — R69 Illness, unspecified: Secondary | ICD-10-CM | POA: Diagnosis not present

## 2019-06-19 DIAGNOSIS — R69 Illness, unspecified: Secondary | ICD-10-CM | POA: Diagnosis not present

## 2019-06-26 DIAGNOSIS — R69 Illness, unspecified: Secondary | ICD-10-CM | POA: Diagnosis not present

## 2019-07-03 DIAGNOSIS — R69 Illness, unspecified: Secondary | ICD-10-CM | POA: Diagnosis not present

## 2019-07-10 DIAGNOSIS — R69 Illness, unspecified: Secondary | ICD-10-CM | POA: Diagnosis not present

## 2019-07-13 DIAGNOSIS — R69 Illness, unspecified: Secondary | ICD-10-CM | POA: Diagnosis not present

## 2019-07-24 DIAGNOSIS — R69 Illness, unspecified: Secondary | ICD-10-CM | POA: Diagnosis not present

## 2019-08-10 ENCOUNTER — Ambulatory Visit: Payer: Medicare HMO | Admitting: Sports Medicine

## 2019-08-28 DIAGNOSIS — R69 Illness, unspecified: Secondary | ICD-10-CM | POA: Diagnosis not present

## 2019-11-04 DIAGNOSIS — J209 Acute bronchitis, unspecified: Secondary | ICD-10-CM | POA: Diagnosis not present

## 2019-11-04 DIAGNOSIS — F17200 Nicotine dependence, unspecified, uncomplicated: Secondary | ICD-10-CM | POA: Diagnosis not present

## 2020-01-09 DIAGNOSIS — J449 Chronic obstructive pulmonary disease, unspecified: Secondary | ICD-10-CM | POA: Diagnosis not present

## 2020-01-16 DIAGNOSIS — Z79899 Other long term (current) drug therapy: Secondary | ICD-10-CM | POA: Diagnosis not present

## 2020-01-16 DIAGNOSIS — M1611 Unilateral primary osteoarthritis, right hip: Secondary | ICD-10-CM | POA: Diagnosis not present

## 2020-01-16 DIAGNOSIS — E785 Hyperlipidemia, unspecified: Secondary | ICD-10-CM | POA: Diagnosis not present

## 2020-01-16 DIAGNOSIS — E1140 Type 2 diabetes mellitus with diabetic neuropathy, unspecified: Secondary | ICD-10-CM | POA: Diagnosis not present

## 2020-01-16 DIAGNOSIS — E039 Hypothyroidism, unspecified: Secondary | ICD-10-CM | POA: Diagnosis not present

## 2020-01-16 DIAGNOSIS — E1165 Type 2 diabetes mellitus with hyperglycemia: Secondary | ICD-10-CM | POA: Diagnosis not present

## 2020-01-16 DIAGNOSIS — J449 Chronic obstructive pulmonary disease, unspecified: Secondary | ICD-10-CM | POA: Diagnosis not present

## 2020-02-09 DIAGNOSIS — J449 Chronic obstructive pulmonary disease, unspecified: Secondary | ICD-10-CM | POA: Diagnosis not present

## 2020-02-29 DIAGNOSIS — T63441A Toxic effect of venom of bees, accidental (unintentional), initial encounter: Secondary | ICD-10-CM | POA: Diagnosis not present

## 2020-02-29 DIAGNOSIS — Z5321 Procedure and treatment not carried out due to patient leaving prior to being seen by health care provider: Secondary | ICD-10-CM | POA: Diagnosis not present

## 2020-03-08 DIAGNOSIS — J449 Chronic obstructive pulmonary disease, unspecified: Secondary | ICD-10-CM | POA: Diagnosis not present

## 2020-04-08 DIAGNOSIS — J449 Chronic obstructive pulmonary disease, unspecified: Secondary | ICD-10-CM | POA: Diagnosis not present

## 2020-05-08 DIAGNOSIS — J449 Chronic obstructive pulmonary disease, unspecified: Secondary | ICD-10-CM | POA: Diagnosis not present

## 2020-05-15 DIAGNOSIS — R5381 Other malaise: Secondary | ICD-10-CM | POA: Diagnosis not present

## 2020-05-15 DIAGNOSIS — Z79899 Other long term (current) drug therapy: Secondary | ICD-10-CM | POA: Diagnosis not present

## 2020-05-15 DIAGNOSIS — M25571 Pain in right ankle and joints of right foot: Secondary | ICD-10-CM | POA: Diagnosis not present

## 2020-05-15 DIAGNOSIS — E039 Hypothyroidism, unspecified: Secondary | ICD-10-CM | POA: Diagnosis not present

## 2020-05-15 DIAGNOSIS — J449 Chronic obstructive pulmonary disease, unspecified: Secondary | ICD-10-CM | POA: Diagnosis not present

## 2020-05-15 DIAGNOSIS — I10 Essential (primary) hypertension: Secondary | ICD-10-CM | POA: Diagnosis not present

## 2020-05-15 DIAGNOSIS — M1990 Unspecified osteoarthritis, unspecified site: Secondary | ICD-10-CM | POA: Diagnosis not present

## 2020-05-15 DIAGNOSIS — M25572 Pain in left ankle and joints of left foot: Secondary | ICD-10-CM | POA: Diagnosis not present

## 2020-05-15 DIAGNOSIS — Z5900 Homelessness unspecified: Secondary | ICD-10-CM | POA: Diagnosis not present

## 2020-05-26 DIAGNOSIS — R404 Transient alteration of awareness: Secondary | ICD-10-CM | POA: Diagnosis not present

## 2020-05-26 DIAGNOSIS — R6889 Other general symptoms and signs: Secondary | ICD-10-CM | POA: Diagnosis not present

## 2020-05-26 DIAGNOSIS — I499 Cardiac arrhythmia, unspecified: Secondary | ICD-10-CM | POA: Diagnosis not present

## 2020-05-26 DIAGNOSIS — R451 Restlessness and agitation: Secondary | ICD-10-CM | POA: Diagnosis not present

## 2020-05-26 DIAGNOSIS — J9811 Atelectasis: Secondary | ICD-10-CM | POA: Diagnosis not present

## 2020-05-26 DIAGNOSIS — Z79899 Other long term (current) drug therapy: Secondary | ICD-10-CM | POA: Diagnosis not present

## 2020-05-26 DIAGNOSIS — Z20822 Contact with and (suspected) exposure to covid-19: Secondary | ICD-10-CM | POA: Diagnosis not present

## 2020-05-26 DIAGNOSIS — R410 Disorientation, unspecified: Secondary | ICD-10-CM | POA: Diagnosis not present

## 2020-05-26 DIAGNOSIS — Z743 Need for continuous supervision: Secondary | ICD-10-CM | POA: Diagnosis not present

## 2020-05-27 DIAGNOSIS — M25521 Pain in right elbow: Secondary | ICD-10-CM | POA: Diagnosis not present

## 2020-05-27 DIAGNOSIS — M25511 Pain in right shoulder: Secondary | ICD-10-CM | POA: Diagnosis not present

## 2020-06-08 DIAGNOSIS — J449 Chronic obstructive pulmonary disease, unspecified: Secondary | ICD-10-CM | POA: Diagnosis not present

## 2020-06-24 DIAGNOSIS — R519 Headache, unspecified: Secondary | ICD-10-CM | POA: Diagnosis not present

## 2020-06-24 DIAGNOSIS — G4489 Other headache syndrome: Secondary | ICD-10-CM | POA: Diagnosis not present

## 2020-06-24 DIAGNOSIS — R52 Pain, unspecified: Secondary | ICD-10-CM | POA: Diagnosis not present

## 2020-06-24 DIAGNOSIS — F17200 Nicotine dependence, unspecified, uncomplicated: Secondary | ICD-10-CM | POA: Diagnosis not present

## 2020-06-24 DIAGNOSIS — Z888 Allergy status to other drugs, medicaments and biological substances status: Secondary | ICD-10-CM | POA: Diagnosis not present

## 2020-06-24 DIAGNOSIS — E162 Hypoglycemia, unspecified: Secondary | ICD-10-CM | POA: Diagnosis not present

## 2020-06-24 DIAGNOSIS — E161 Other hypoglycemia: Secondary | ICD-10-CM | POA: Diagnosis not present

## 2020-06-24 DIAGNOSIS — Z743 Need for continuous supervision: Secondary | ICD-10-CM | POA: Diagnosis not present

## 2020-07-01 DIAGNOSIS — F17210 Nicotine dependence, cigarettes, uncomplicated: Secondary | ICD-10-CM | POA: Diagnosis not present

## 2020-07-01 DIAGNOSIS — S0003XA Contusion of scalp, initial encounter: Secondary | ICD-10-CM | POA: Diagnosis not present

## 2020-07-01 DIAGNOSIS — S0990XA Unspecified injury of head, initial encounter: Secondary | ICD-10-CM | POA: Diagnosis not present

## 2020-07-01 DIAGNOSIS — R296 Repeated falls: Secondary | ICD-10-CM | POA: Diagnosis not present

## 2020-07-01 DIAGNOSIS — R519 Headache, unspecified: Secondary | ICD-10-CM | POA: Diagnosis not present

## 2020-07-01 DIAGNOSIS — Z888 Allergy status to other drugs, medicaments and biological substances status: Secondary | ICD-10-CM | POA: Diagnosis not present

## 2020-07-08 DIAGNOSIS — J449 Chronic obstructive pulmonary disease, unspecified: Secondary | ICD-10-CM | POA: Diagnosis not present

## 2020-07-15 DIAGNOSIS — M1611 Unilateral primary osteoarthritis, right hip: Secondary | ICD-10-CM | POA: Diagnosis not present

## 2020-07-15 DIAGNOSIS — E039 Hypothyroidism, unspecified: Secondary | ICD-10-CM | POA: Diagnosis not present

## 2020-07-15 DIAGNOSIS — Z79899 Other long term (current) drug therapy: Secondary | ICD-10-CM | POA: Diagnosis not present

## 2020-07-15 DIAGNOSIS — J449 Chronic obstructive pulmonary disease, unspecified: Secondary | ICD-10-CM | POA: Diagnosis not present

## 2020-07-15 DIAGNOSIS — E785 Hyperlipidemia, unspecified: Secondary | ICD-10-CM | POA: Diagnosis not present

## 2020-08-08 DIAGNOSIS — J449 Chronic obstructive pulmonary disease, unspecified: Secondary | ICD-10-CM | POA: Diagnosis not present

## 2020-08-22 DIAGNOSIS — Z9114 Patient's other noncompliance with medication regimen: Secondary | ICD-10-CM | POA: Diagnosis not present

## 2020-08-22 DIAGNOSIS — Z5900 Homelessness unspecified: Secondary | ICD-10-CM | POA: Diagnosis not present

## 2020-09-08 DIAGNOSIS — J449 Chronic obstructive pulmonary disease, unspecified: Secondary | ICD-10-CM | POA: Diagnosis not present

## 2020-10-08 DIAGNOSIS — J449 Chronic obstructive pulmonary disease, unspecified: Secondary | ICD-10-CM | POA: Diagnosis not present

## 2020-11-08 DIAGNOSIS — J449 Chronic obstructive pulmonary disease, unspecified: Secondary | ICD-10-CM | POA: Diagnosis not present

## 2020-11-17 ENCOUNTER — Encounter: Payer: Self-pay | Admitting: Emergency Medicine

## 2020-11-17 DIAGNOSIS — E039 Hypothyroidism, unspecified: Secondary | ICD-10-CM | POA: Insufficient documentation

## 2020-11-17 DIAGNOSIS — Z7982 Long term (current) use of aspirin: Secondary | ICD-10-CM | POA: Insufficient documentation

## 2020-11-17 DIAGNOSIS — Z5900 Homelessness unspecified: Secondary | ICD-10-CM | POA: Insufficient documentation

## 2020-11-17 DIAGNOSIS — J449 Chronic obstructive pulmonary disease, unspecified: Secondary | ICD-10-CM | POA: Insufficient documentation

## 2020-11-17 DIAGNOSIS — J4541 Moderate persistent asthma with (acute) exacerbation: Secondary | ICD-10-CM | POA: Diagnosis not present

## 2020-11-17 DIAGNOSIS — Z87891 Personal history of nicotine dependence: Secondary | ICD-10-CM | POA: Diagnosis not present

## 2020-11-17 DIAGNOSIS — Z760 Encounter for issue of repeat prescription: Secondary | ICD-10-CM | POA: Diagnosis not present

## 2020-11-17 DIAGNOSIS — Z79899 Other long term (current) drug therapy: Secondary | ICD-10-CM | POA: Insufficient documentation

## 2020-11-17 NOTE — ED Triage Notes (Signed)
C/O being out of medication.  Repeatedly states he is homeless and appears intoxicated.

## 2020-11-18 ENCOUNTER — Emergency Department
Admission: EM | Admit: 2020-11-18 | Discharge: 2020-11-18 | Disposition: A | Discharge status: Home / Self Care | Payer: Medicare Other | Attending: Emergency Medicine | Admitting: Emergency Medicine

## 2020-11-18 DIAGNOSIS — Z5900 Homelessness unspecified: Secondary | ICD-10-CM

## 2020-11-18 DIAGNOSIS — Z760 Encounter for issue of repeat prescription: Secondary | ICD-10-CM

## 2020-11-18 MED ORDER — BUPROPION HCL ER (SR) 150 MG PO TB12: 150.0000 mg | ORAL_TABLET | Freq: Every day | ORAL | 0 refills | Status: AC

## 2020-11-18 NOTE — ED Provider Notes (Signed)
[facility] Emergency Department Provider Note  ____________________________________________   Event Date/Time   First MD Initiated Contact with Patient 11/18/20 0016     (approximate)  I have reviewed the triage vital signs and the nursing notes.   HISTORY  Chief Complaint Medication Refill  Level 5 caveat: History may be limited by the patient's history of chronic psychiatric illness and/or lack of cooperation.  HPI Dustin Barton is a 65 y.o. male with mental and psychiatric history as listed below who presents requesting a medication refill.  He states that he is homeless and he needs a Wellbutrin refill.  He claims that he recently moved to this area from elsewhere "and I am thinking about staying".  He says he has been out of his medications for a while but is vague about how long.  He denies suicidal ideation, chest pain, shortness of breath, and abdominal pain.  He is otherwise difficult to keep on topic and prefers to speak about his social situation.     Past Medical History:  Diagnosis Date   Bipolar 1 disorder ([employer])    COPD (chronic obstructive pulmonary disease) ([place])    Hyperlipidemia    Hypothyroidism    Osteoarthritis    Schizophrenia ([place])     Patient Active Problem List   Diagnosis Date Noted   Severe obesity (BMI >= 40) ([place]) 10/16/2013   Osteoarthritis    Moderate persistent asthma with acute exacerbation    Hypothyroidism    Schizophrenia ([place])    Hyperlipidemia    Bipolar 1 disorder ([place])     Past Surgical History:  Procedure Laterality Date   THYROIDECTOMY     left    Prior to Admission medications   Medication Sig Start Date End Date Taking? Authorizing Provider  ACCU-CHEK FASTCLIX LANCETS MISC USE 1 DAILY 05/18/17   [provider]  albuterol (PROAIR HFA) 108 (90 BASE) MCG/ACT inhaler Inhale 2 puffs into the lungs every 4 (four) hours as needed for wheezing or shortness of breath. 11/20/13   Elsie Stain, MD  ARIPiprazole (ABILIFY) 20 MG tablet Take 20 mg by mouth daily.    [provider]  aspirin (ASPIRIN 81) 81 MG EC tablet Take by mouth.    [provider]  BD PEN NEEDLE NANO U/F 32G X 4 MM MISC USE FOR DAILY INJECTION 02/04/18   [provider]  benztropine (COGENTIN) 1 MG tablet Take 1 mg by mouth 2 (two) times daily.    [provider]  buPROPion (WELLBUTRIN SR) 150 MG 12 hr tablet Take 1 tablet (150 mg total) by mouth daily. 11/18/20 12/18/20  Hinda Kehr, MD  divalproex (DEPAKOTE) 500 MG DR tablet Take 2 tablets by mouth. Take 1 tablet in morning and 2 tablets at night.    [provider]  FARXIGA 10 MG TABS tablet Take 10 mg by mouth daily. 05/30/17   [provider]  fenofibrate micronized (LOFIBRA) 134 MG capsule Take 1 capsule by mouth daily.    [provider]  levothyroxine (SYNTHROID, LEVOTHROID) 25 MCG tablet Take 25 mcg by mouth daily before breakfast.    [provider]  Multiple Vitamin (MULTIVITAMIN) tablet Take 1 tablet by mouth daily.    [provider]  Multiple Vitamins-Minerals (MULTIVITAMIN WITH MINERALS) tablet Take one tablet at bedtime    [provider]  Omega-3 Fatty Acids (FISH OIL) 1200 MG CAPS Take 1 capsule by mouth 2 (two) times daily.    [provider]  omeprazole (PRILOSEC) 20 MG capsule Take 1 capsule by mouth daily.    [provider]  pravastatin (PRAVACHOL) 10 MG tablet  02/06/18   [provider]  tamsulosin (FLOMAX) 0.4 MG CAPS capsule TAKE 1 CAPSULE BY MOUTH EVERY DAY AT BEDTIME 05/30/17   [provider]  [place] 18 MG/3ML SOPN  11/07/17   [provider]    Allergies Artane [trihexyphenidyl], Haldol [haloperidol], and Trihexyphenidyl hcl  Family History  Problem Relation Age of Onset   Skin cancer Father    Diabetes Sister    Hepatitis Brother    Lung disease Sister     Social History Social History    Tobacco Use   Smoking status: Former    Packs/day: 2.00    Years: 34.00    Pack years: 68.00    Types: Cigarettes    Quit date: 02/05/2012    Years since quitting: 8.7   Smokeless tobacco: Never  Substance Use Topics   Alcohol use: No    Comment: quit   Drug use: No    Comment: quit     Review of Systems Level 5 caveat: History may be limited by the patient's history of chronic psychiatric illness and/or lack of cooperation. ____________________________________________   PHYSICAL EXAM:  VITAL SIGNS: ED Triage Vitals  Enc Vitals Group     BP 11/17/20 1836 124/74     Pulse Rate 11/17/20 1836 (!) 103     Resp 11/17/20 1836 16     Temp 11/17/20 1836 98.9 F (37.2 C)     Temp Source 11/17/20 1836 Oral     SpO2 11/17/20 1836 95 %     Weight 11/17/20 1758 (!) 140 kg (308 lb 10.3 oz)     Height 11/17/20 1758 1.753 m (5\' 9" )     Head Circumference --      Peak Flow --      Pain Score 11/17/20 1757 0     Pain Loc --      Pain Edu? --      Excl. in [place]? --     Constitutional: Alert and oriented.  Disheveled.  Ambulatory. Eyes: Conjunctivae are normal.  Head: Atraumatic. Nose: No congestion/rhinnorhea. Mouth/Throat: Patient is wearing a mask. Neck: No stridor.  No meningeal signs.   Cardiovascular: Borderline tachycardia at triage, normal rate when I assessed him, regular rhythm. Good peripheral circulation. Respiratory: Normal respiratory effort.  No retractions. Gastrointestinal: Soft and nontender. No distention.  Musculoskeletal: No lower extremity tenderness nor edema. No gross deformities of extremities. Neurologic:  Normal speech and language. No gross focal neurologic deficits are appreciated.  Skin:  Skin is warm, dry and intact. Psychiatric: Mood and affect are calm and cooperative.  He speaks extensively about his social situation as well as his need for a mood stabilizer.  He is somewhat evasive with his answers.  He does not seem to be responding to internal  stimuli.  No indication that he is experiencing hallucinations.  No expression of SI nor HI.  ____________________________________________    INITIAL IMPRESSION / MDM / ASSESSMENT AND PLAN / ED COURSE  As part of my medical decision making, I reviewed the following data within the [facility] notes reviewed and incorporated, Old chart reviewed, and Notes from prior ED visits   Differential diagnosis includes, but is not limited to, malingering, secondary gain such as staying out of the cold, chronic psychiatric illness, intoxication.  The patient waited in the emergency department  for about 7 hours prior to being seen due to overwhelming ED patient and hospital patient volumes and limited staffing.  He was clinically sober by the time I saw him even if he had been intoxicated previously.  He is asking for Wellbutrin and I see in his medical record he has been on Wellbutrin in the past.  I do not believe he requires psychiatric assessment nor does he meet any requirements for medical work-up or additional assessment or evaluation.  I offered to refill the patient's Wellbutrin and provide him outpatient resources such as outpatient counseling centers and local homeless shelters.  He agreed with the plan.  He was discharged to the waiting room with no evidence of an emergent medical condition.           ____________________________________________  FINAL CLINICAL IMPRESSION(S) / ED DIAGNOSES  Final diagnoses:  Medication refill  Homelessness     MEDICATIONS GIVEN DURING THIS VISIT:  Medications - No data to display   ED Discharge Orders          Ordered    buPROPion (WELLBUTRIN SR) 150 MG 12 hr tablet  Daily        11/18/20 0043             Note:  This document was prepared using Dragon voice recognition software and may include unintentional dictation errors.   Hinda Kehr, MD 11/18/20 1122

## 2020-11-18 NOTE — ED Notes (Signed)
Patient states he is homeless and has state and is disabled. Patient also states he has been off his medications for a long time.

## 2020-11-18 NOTE — ED Notes (Signed)
Provider at bedside

## 2020-11-24 ENCOUNTER — Emergency Department: Payer: Medicare Other

## 2020-11-24 ENCOUNTER — Emergency Department
Admission: EM | Admit: 2020-11-24 | Discharge: 2020-11-24 | Disposition: A | Discharge status: Home / Self Care | Payer: Medicare Other | Attending: Emergency Medicine | Admitting: Emergency Medicine

## 2020-11-24 DIAGNOSIS — Z20822 Contact with and (suspected) exposure to covid-19: Secondary | ICD-10-CM | POA: Insufficient documentation

## 2020-11-24 DIAGNOSIS — R0902 Hypoxemia: Secondary | ICD-10-CM | POA: Diagnosis not present

## 2020-11-24 DIAGNOSIS — Z79899 Other long term (current) drug therapy: Secondary | ICD-10-CM | POA: Diagnosis not present

## 2020-11-24 DIAGNOSIS — J449 Chronic obstructive pulmonary disease, unspecified: Secondary | ICD-10-CM | POA: Diagnosis not present

## 2020-11-24 DIAGNOSIS — E039 Hypothyroidism, unspecified: Secondary | ICD-10-CM | POA: Insufficient documentation

## 2020-11-24 DIAGNOSIS — Z743 Need for continuous supervision: Secondary | ICD-10-CM | POA: Diagnosis not present

## 2020-11-24 DIAGNOSIS — R451 Restlessness and agitation: Secondary | ICD-10-CM | POA: Insufficient documentation

## 2020-11-24 DIAGNOSIS — T68XXXA Hypothermia, initial encounter: Secondary | ICD-10-CM | POA: Diagnosis not present

## 2020-11-24 DIAGNOSIS — R531 Weakness: Secondary | ICD-10-CM | POA: Diagnosis not present

## 2020-11-24 DIAGNOSIS — Z7982 Long term (current) use of aspirin: Secondary | ICD-10-CM | POA: Insufficient documentation

## 2020-11-24 DIAGNOSIS — X31XXXA Exposure to excessive natural cold, initial encounter: Secondary | ICD-10-CM | POA: Insufficient documentation

## 2020-11-24 DIAGNOSIS — Z87891 Personal history of nicotine dependence: Secondary | ICD-10-CM | POA: Diagnosis not present

## 2020-11-24 DIAGNOSIS — J45909 Unspecified asthma, uncomplicated: Secondary | ICD-10-CM | POA: Insufficient documentation

## 2020-11-24 DIAGNOSIS — R6889 Other general symptoms and signs: Secondary | ICD-10-CM | POA: Diagnosis not present

## 2020-11-24 DIAGNOSIS — R4182 Altered mental status, unspecified: Secondary | ICD-10-CM

## 2020-11-24 DIAGNOSIS — R0602 Shortness of breath: Secondary | ICD-10-CM | POA: Diagnosis not present

## 2020-11-24 DIAGNOSIS — I6782 Cerebral ischemia: Secondary | ICD-10-CM | POA: Diagnosis not present

## 2020-11-24 LAB — URINALYSIS, COMPLETE (UACMP) WITH MICROSCOPIC
Bacteria, UA: NONE SEEN
Bilirubin Urine: NEGATIVE
Glucose, UA: 50 mg/dL — AB
Hgb urine dipstick: NEGATIVE
Ketones, ur: NEGATIVE mg/dL
Leukocytes,Ua: NEGATIVE
Nitrite: NEGATIVE
Protein, ur: NEGATIVE mg/dL
Specific Gravity, Urine: 1.006 (ref 1.005–1.030)
pH: 6 (ref 5.0–8.0)

## 2020-11-24 LAB — CK: Total CK: 358 U/L (ref 49–397)

## 2020-11-24 LAB — COMPREHENSIVE METABOLIC PANEL
ALT: 25 U/L (ref 0–44)
AST: 34 U/L (ref 15–41)
Albumin: 3.8 g/dL (ref 3.5–5.0)
Alkaline Phosphatase: 83 U/L (ref 38–126)
Anion gap: 10 (ref 5–15)
BUN: 8 mg/dL (ref 8–23)
CO2: 25 mmol/L (ref 22–32)
Calcium: 8.5 mg/dL — ABNORMAL LOW (ref 8.9–10.3)
Chloride: 104 mmol/L (ref 98–111)
Creatinine, Ser: 0.52 mg/dL — ABNORMAL LOW (ref 0.61–1.24)
GFR, Estimated: >60 mL/min (ref >60)
Glucose, Bld: 229 mg/dL — ABNORMAL HIGH (ref 70–99)
Potassium: 3.1 mmol/L — ABNORMAL LOW (ref 3.5–5.1)
Sodium: 139 mmol/L (ref 135–145)
Total Bilirubin: 0.7 mg/dL (ref 0.3–1.2)
Total Protein: 6.8 g/dL (ref 6.5–8.1)

## 2020-11-24 LAB — CBC WITH DIFFERENTIAL/PLATELET
Abs Immature Granulocytes: 0.03 10*3/uL (ref 0.00–0.07)
Basophils Absolute: 0.1 10*3/uL (ref 0.0–0.1)
Basophils Relative: 1 %
Eosinophils Absolute: 0.1 10*3/uL (ref 0.0–0.5)
Eosinophils Relative: 3 %
HCT: 41.8 % (ref 39.0–52.0)
Hemoglobin: 14.6 g/dL (ref 13.0–17.0)
Immature Granulocytes: 1 %
Lymphocytes Relative: 24 %
Lymphs Abs: 1.3 10*3/uL (ref 0.7–4.0)
MCH: 30.5 pg (ref 26.0–34.0)
MCHC: 34.9 g/dL (ref 30.0–36.0)
MCV: 87.3 fL (ref 80.0–100.0)
Monocytes Absolute: 0.7 10*3/uL (ref 0.1–1.0)
Monocytes Relative: 13 %
Neutro Abs: 3.3 10*3/uL (ref 1.7–7.7)
Neutrophils Relative %: 58 %
Platelets: 223 10*3/uL (ref 150–400)
RBC: 4.79 MIL/uL (ref 4.22–5.81)
RDW: 13.6 % (ref 11.5–15.5)
WBC: 5.5 10*3/uL (ref 4.0–10.5)
nRBC: 0 % (ref 0.0–0.2)

## 2020-11-24 LAB — URINE DRUG SCREEN, QUALITATIVE (ARMC ONLY)
Amphetamines, Ur Screen: NOT DETECTED
Barbiturates, Ur Screen: NOT DETECTED
Benzodiazepine, Ur Scrn: NOT DETECTED
Cannabinoid 50 Ng, Ur ~~LOC~~: POSITIVE — AB
Cocaine Metabolite,Ur ~~LOC~~: NOT DETECTED
MDMA (Ecstasy)Ur Screen: NOT DETECTED
Methadone Scn, Ur: NOT DETECTED
Opiate, Ur Screen: NOT DETECTED
Phencyclidine (PCP) Ur S: NOT DETECTED
Tricyclic, Ur Screen: NOT DETECTED

## 2020-11-24 LAB — RESP PANEL BY RT-PCR (FLU A&B, COVID) ARPGX2
Influenza A by PCR: NEGATIVE
Influenza B by PCR: NEGATIVE
SARS Coronavirus 2 by RT PCR: NEGATIVE

## 2020-11-24 LAB — LACTIC ACID, PLASMA: Lactic Acid, Venous: 1.9 mmol/L (ref 0.5–1.9)

## 2020-11-24 LAB — TSH: TSH: 1.33 u[IU]/mL (ref 0.350–4.500)

## 2020-11-24 LAB — MAGNESIUM: Magnesium: 1.9 mg/dL (ref 1.7–2.4)

## 2020-11-24 LAB — ETHANOL: Alcohol, Ethyl (B): <10 mg/dL (ref <10)

## 2020-11-24 MED ORDER — SODIUM CHLORIDE 0.9 % IV BOLUS
1000.0000 mL | Freq: Once | INTRAVENOUS | Status: AC
  Administered 2020-11-24: 1000 mL via INTRAVENOUS

## 2020-11-24 MED ORDER — MIDAZOLAM HCL 2 MG/2ML IJ SOLN
INTRAMUSCULAR | Status: AC
  Administered 2020-11-24: 2 mg via INTRAMUSCULAR
  Filled 2020-11-24: qty 2

## 2020-11-24 MED ORDER — MIDAZOLAM HCL 2 MG/2ML IJ SOLN: 2.0000 mg | Freq: Once | INTRAMUSCULAR | Status: AC

## 2020-11-24 NOTE — ED Provider Notes (Signed)
Patient now back to normothermia. He is awake, alert, ambulating. He has a friend who can pick him up and will let him stay out of the cold. D/c in stable condition.   Duffy Bruce, MD 11/24/20 1431

## 2020-11-24 NOTE — ED Notes (Signed)
Ct tech reported upon returning from scan that the pt had woke up and took his IV out. Doctor notified.

## 2020-11-24 NOTE — ED Notes (Signed)
Spoke to Dr. Ellender Hose, no need for lactic acid per Dr. Ellender Hose. Waiting on patient's temperature to improve and consult from social work/care management.

## 2020-11-24 NOTE — ED Provider Notes (Signed)
[facility]  ____________________________________________   Event Date/Time   First MD Initiated Contact with Patient 11/24/20 [phone]     (approximate)  I have reviewed the triage vital signs and the nursing notes.   HISTORY  Chief Complaint Shortness of Breath    HPI Dustin Barton is a 65 y.o. male with pmh schizophrenia, bipolar disorder, hypothyroidism who presents with altered mental status.  Apparently patient was found outside not wearing a coat.  Initially had complained of shortness of breath.  He was transported to the ED by EMS.  On arrival patient is significantly agitated and refusing vital signs.  I am unable to obtain history from the patient due to his agitation.         Past Medical History:  Diagnosis Date   Bipolar 1 disorder ([place])    COPD (chronic obstructive pulmonary disease) ([place])    Hyperlipidemia    Hypothyroidism    Osteoarthritis    Schizophrenia ([place])     Patient Active Problem List   Diagnosis Date Noted   Severe obesity (BMI >= 40) ([place]) 10/16/2013   Osteoarthritis    Moderate persistent asthma with acute exacerbation    Hypothyroidism    Schizophrenia ([place])    Hyperlipidemia    Bipolar 1 disorder ([place])     Past Surgical History:  Procedure Laterality Date   THYROIDECTOMY     left    Prior to Admission medications   Medication Sig Start Date End Date Taking? Authorizing Provider  ACCU-CHEK FASTCLIX LANCETS MISC USE 1 DAILY 05/18/17   [provider]  albuterol (PROAIR HFA) 108 (90 BASE) MCG/ACT inhaler Inhale 2 puffs into the lungs every 4 (four) hours as needed for wheezing or shortness of breath. 11/20/13   Elsie Stain, MD  ARIPiprazole (ABILIFY) 20 MG tablet Take 20 mg by mouth daily.    [provider]  aspirin (ASPIRIN 81) 81 MG EC tablet Take by mouth.    [provider]  BD PEN NEEDLE NANO U/F 32G X 4 MM MISC USE FOR DAILY INJECTION 02/04/18   [provider]   benztropine (COGENTIN) 1 MG tablet Take 1 mg by mouth 2 (two) times daily.    [provider]  buPROPion (WELLBUTRIN SR) 150 MG 12 hr tablet Take 1 tablet (150 mg total) by mouth daily. 11/18/20 12/18/20  Hinda Kehr, MD  divalproex (DEPAKOTE) 500 MG DR tablet Take 2 tablets by mouth. Take 1 tablet in morning and 2 tablets at night.    [provider]  FARXIGA 10 MG TABS tablet Take 10 mg by mouth daily. 05/30/17   [provider]  fenofibrate micronized (LOFIBRA) 134 MG capsule Take 1 capsule by mouth daily.    [provider]  levothyroxine (SYNTHROID, LEVOTHROID) 25 MCG tablet Take 25 mcg by mouth daily before breakfast.    [provider]  Multiple Vitamin (MULTIVITAMIN) tablet Take 1 tablet by mouth daily.    [provider]  Multiple Vitamins-Minerals (MULTIVITAMIN WITH MINERALS) tablet Take one tablet at bedtime    [provider]  Omega-3 Fatty Acids (FISH OIL) 1200 MG CAPS Take 1 capsule by mouth 2 (two) times daily.    [provider]  omeprazole (PRILOSEC) 20 MG capsule Take 1 capsule by mouth daily.    [provider]  pravastatin (PRAVACHOL) 10 MG tablet  02/06/18   [provider]  tamsulosin (FLOMAX) 0.4 MG CAPS capsule TAKE 1 CAPSULE BY MOUTH EVERY DAY AT  BEDTIME 05/30/17   [provider]  [place] 18 MG/3ML SOPN  11/07/17   [provider]    Allergies Artane [trihexyphenidyl], Haldol [haloperidol], and Trihexyphenidyl hcl  Family History  Problem Relation Age of Onset   Skin cancer Father    Diabetes Sister    Hepatitis Brother    Lung disease Sister     Social History Social History   Tobacco Use   Smoking status: Former    Packs/day: 2.00    Years: 34.00    Pack years: 68.00    Types: Cigarettes    Quit date: 02/05/2012    Years since quitting: 8.8   Smokeless tobacco: Never  Substance Use Topics   Alcohol use: No    Comment: quit   Drug use: No     Comment: quit     Review of Systems   Review of Systems  Unable to perform ROS: Mental status change   Physical Exam Updated Vital Signs BP 107/76   Pulse 65   Temp (!) 95.4 F (35.2 C)   Resp 14   SpO2 100%   Physical Exam Vitals and nursing note reviewed.  Constitutional:      General: He is not in acute distress.    Appearance: Normal appearance.     Comments: Patient appears disheveled  Very cold to touch  HENT:     Head: Normocephalic and atraumatic.  Eyes:     General: No scleral icterus.    Conjunctiva/sclera: Conjunctivae normal.     Pupils: Pupils are equal, round, and reactive to light.  Cardiovascular:     Rate and Rhythm: Normal rate and regular rhythm.  Pulmonary:     Effort: Pulmonary effort is normal. No respiratory distress.     Breath sounds: Normal breath sounds. No wheezing.  Abdominal:     Palpations: Abdomen is soft.  Musculoskeletal:        General: No deformity or signs of injury.     Cervical back: Normal range of motion.     Right lower leg: No edema.  Skin:    Coloration: Skin is not jaundiced or pale.     Comments: Cold to touch  Neurological:     Mental Status: He is alert. Mental status is at baseline.     Comments: Patient is dysarthric, sitting up in the stretcher, moving all extremities symmetrically   Psychiatric:        Mood and Affect: Mood normal.        Behavior: Behavior normal.     Comments: Patient is agitated and combative     LABS (all labs ordered are listed, but only abnormal results are displayed)  Labs Reviewed  COMPREHENSIVE METABOLIC PANEL - Abnormal; Notable for the following components:      Result Value   Potassium 3.1 (*)    Glucose, Bld 229 (*)    Creatinine, Ser 0.52 (*)    Calcium 8.5 (*)    All other components within normal limits  URINE DRUG SCREEN, QUALITATIVE ([facility] ONLY) - Abnormal; Notable for the following components:   Cannabinoid 50 Ng, Ur [place] POSITIVE (*)    All other components within  normal limits  URINALYSIS, COMPLETE (UACMP) WITH MICROSCOPIC - Abnormal; Notable for the following components:   Color, Urine YELLOW (*)    APPearance CLEAR (*)    Glucose, UA 50 (*)    All other components within normal limits  RESP PANEL BY RT-PCR (FLU A&B, COVID) ARPGX2  CBC WITH  DIFFERENTIAL/PLATELET  ETHANOL  LACTIC ACID, PLASMA  TSH  CK  MAGNESIUM  LACTIC ACID, PLASMA   ____________________________________________  EKG  Normal sinus rhythm, normal axis no acute ischemic changes ____________________________________________  RADIOLOGY Almeta Monas, personally viewed and evaluated these images (plain radiographs) as part of my medical decision making, as well as reviewing the written report by the radiologist.  ED MD interpretation: I reviewed the CXR which does not show any acute cardiopulmonary process '  I reviewed the CT scan of the brain which does not show any acute intracranial process    ____________________________________________   PROCEDURES  Procedure(s) performed (including Critical Care):  .Critical Care Performed by: Rada Hay, MD Authorized by: Rada Hay, MD   Critical care provider statement:    Critical care time (minutes):  30   Critical care was time spent personally by me on the following activities:  Development of treatment plan with patient or surrogate, discussions with consultants, evaluation of patient's response to treatment, examination of patient, ordering and review of laboratory studies, ordering and review of radiographic studies, ordering and performing treatments and interventions, pulse oximetry, re-evaluation of patient's condition and review of old charts   ____________________________________________   INITIAL IMPRESSION / ASSESSMENT AND PLAN / ED COURSE   65 yo male presents with altered mental status.  Was apparently found lying outside in the cold.  On arrival patient is somewhat combative he is  agitated and refusing vital sign evaluation.  He is dysarthric but moves all his extremities.  He was given 2 of IM Versed to facilitate his medical evaluation given the concern for hypothermia.  Core temperature measured at 90 F, rest of his vital signs within normal limits.  On exam he does not have any signs of trauma, abdomen soft lungs clear.  Laboratory analysis was overall unremarkable, no leukocytosis, normal lactate, TSH and electrolytes all within normal limits other than mildly low potassium at 3.1.  Chest x-ray did not show any infiltrate.  CT head was negative. UA was negative.  UDS positive for cannabinoids only.  Ethanol level negative.  Patient was placed on a Bare hugger and given 2 L of warm fluids.  His temperature slowly increased to normal.  Patient's mental status also significantly improved.  On reevaluation he was alert and oriented x3 although still somewhat dysarthric although patient tells me this is because his mouth significantly dry and he is also missing his teeth.  He has no focal neurologic findings otherwise.  Denies complaints.  He is homeless and that he had used weed Gummies which would explain why he is somewhat altered and the positive THC in his urine.  Ultimately I feel that his presentation can be explained by environmental hyperthermia in the setting of his mental illness and intoxication with THC.  Do not feel that this is infectious in etiology and I have not identified any other toxic or metabolic etiology that would otherwise require treatment in the hospital.  Do feel that patient needs a safer discharge plan as he was clearly not able to care for himself.  The time of signout patient is pending additional rewarming and social work consult.  Clinical Course as of 11/24/20 0740  Mon Nov 24, 2020  3086 Cannabinoid 50 Ng, Ur [place](!): POSITIVE [KM]    Clinical Course User Index [KM] Rada Hay, MD      ____________________________________________   FINAL CLINICAL IMPRESSION(S) / ED DIAGNOSES  Final diagnoses:  Hypothermia associated with environmental  change  Altered mental status, unspecified altered mental status type     ED Discharge Orders     None        Note:  This document was prepared using Dragon voice recognition software and may include unintentional dictation errors.    Rada Hay, MD 11/24/20 [phone]

## 2020-11-24 NOTE — ED Notes (Signed)
Pt now calm, temp probe placed rectally, bear hugger on high placed. Pt's wet clothing removed.

## 2020-11-24 NOTE — ED Notes (Signed)
Patient reported having wallet on arrival to ED. All of patient's belongings searched with patient and no wallet seen. Patient did not remember events leading up to EMS being called or arriving at the hospital. Pt requesting police to be called. Security called to bedside to escort patient out of facility.

## 2020-11-24 NOTE — ED Notes (Signed)
Upon another assessment by dr. Tobey Bride the pt states he ingested 200mg  of cannabis gummies.

## 2020-11-24 NOTE — ED Triage Notes (Signed)
Pt here with ems with possible shob. Pt is not cooperative, striking staff when attempting to get temperature. Pt will not allow staff to get vital signs. Pt with cold to touch skin, admits to etoh ingestion.

## 2020-12-08 DIAGNOSIS — J449 Chronic obstructive pulmonary disease, unspecified: Secondary | ICD-10-CM | POA: Diagnosis not present

## 2020-12-10 DIAGNOSIS — G4489 Other headache syndrome: Secondary | ICD-10-CM | POA: Diagnosis not present

## 2020-12-10 DIAGNOSIS — Z5321 Procedure and treatment not carried out due to patient leaving prior to being seen by health care provider: Secondary | ICD-10-CM | POA: Diagnosis not present

## 2020-12-10 DIAGNOSIS — Z743 Need for continuous supervision: Secondary | ICD-10-CM | POA: Diagnosis not present

## 2020-12-10 DIAGNOSIS — Z760 Encounter for issue of repeat prescription: Secondary | ICD-10-CM | POA: Diagnosis not present

## 2021-01-08 DIAGNOSIS — J449 Chronic obstructive pulmonary disease, unspecified: Secondary | ICD-10-CM | POA: Diagnosis not present

## 2021-01-30 DIAGNOSIS — E119 Type 2 diabetes mellitus without complications: Secondary | ICD-10-CM | POA: Diagnosis not present

## 2021-01-30 DIAGNOSIS — E039 Hypothyroidism, unspecified: Secondary | ICD-10-CM | POA: Diagnosis not present

## 2021-01-30 DIAGNOSIS — I10 Essential (primary) hypertension: Secondary | ICD-10-CM | POA: Diagnosis not present

## 2021-01-30 DIAGNOSIS — E7800 Pure hypercholesterolemia, unspecified: Secondary | ICD-10-CM | POA: Diagnosis not present

## 2021-01-30 DIAGNOSIS — Z20822 Contact with and (suspected) exposure to covid-19: Secondary | ICD-10-CM | POA: Diagnosis not present

## 2021-01-30 DIAGNOSIS — J449 Chronic obstructive pulmonary disease, unspecified: Secondary | ICD-10-CM | POA: Diagnosis not present

## 2021-01-30 DIAGNOSIS — Z79899 Other long term (current) drug therapy: Secondary | ICD-10-CM | POA: Diagnosis not present

## 2021-02-08 DIAGNOSIS — J449 Chronic obstructive pulmonary disease, unspecified: Secondary | ICD-10-CM | POA: Diagnosis not present

## 2021-02-20 DIAGNOSIS — R531 Weakness: Secondary | ICD-10-CM | POA: Diagnosis not present

## 2021-02-20 DIAGNOSIS — R600 Localized edema: Secondary | ICD-10-CM | POA: Diagnosis not present

## 2021-02-20 DIAGNOSIS — Z760 Encounter for issue of repeat prescription: Secondary | ICD-10-CM | POA: Diagnosis not present

## 2021-02-20 DIAGNOSIS — Z743 Need for continuous supervision: Secondary | ICD-10-CM | POA: Diagnosis not present

## 2021-03-08 DIAGNOSIS — J449 Chronic obstructive pulmonary disease, unspecified: Secondary | ICD-10-CM | POA: Diagnosis not present

## 2021-03-10 DIAGNOSIS — Z23 Encounter for immunization: Secondary | ICD-10-CM | POA: Diagnosis not present

## 2021-03-10 DIAGNOSIS — Z79899 Other long term (current) drug therapy: Secondary | ICD-10-CM | POA: Diagnosis not present

## 2021-03-10 DIAGNOSIS — J449 Chronic obstructive pulmonary disease, unspecified: Secondary | ICD-10-CM | POA: Diagnosis not present

## 2021-03-10 DIAGNOSIS — E1165 Type 2 diabetes mellitus with hyperglycemia: Secondary | ICD-10-CM | POA: Diagnosis not present

## 2021-03-10 DIAGNOSIS — M1611 Unilateral primary osteoarthritis, right hip: Secondary | ICD-10-CM | POA: Diagnosis not present

## 2021-03-10 DIAGNOSIS — E039 Hypothyroidism, unspecified: Secondary | ICD-10-CM | POA: Diagnosis not present

## 2021-03-10 DIAGNOSIS — E785 Hyperlipidemia, unspecified: Secondary | ICD-10-CM | POA: Diagnosis not present

## 2021-03-16 DIAGNOSIS — E1165 Type 2 diabetes mellitus with hyperglycemia: Secondary | ICD-10-CM | POA: Diagnosis not present

## 2021-03-28 DIAGNOSIS — R451 Restlessness and agitation: Secondary | ICD-10-CM | POA: Diagnosis not present

## 2021-03-28 DIAGNOSIS — I10 Essential (primary) hypertension: Secondary | ICD-10-CM | POA: Diagnosis not present

## 2021-03-28 DIAGNOSIS — E039 Hypothyroidism, unspecified: Secondary | ICD-10-CM | POA: Diagnosis not present

## 2021-03-28 DIAGNOSIS — E119 Type 2 diabetes mellitus without complications: Secondary | ICD-10-CM | POA: Diagnosis not present

## 2021-03-28 DIAGNOSIS — Z5900 Homelessness unspecified: Secondary | ICD-10-CM | POA: Diagnosis not present

## 2021-03-28 DIAGNOSIS — F17210 Nicotine dependence, cigarettes, uncomplicated: Secondary | ICD-10-CM | POA: Diagnosis not present

## 2021-03-28 DIAGNOSIS — J449 Chronic obstructive pulmonary disease, unspecified: Secondary | ICD-10-CM | POA: Diagnosis not present

## 2021-03-28 DIAGNOSIS — Z79899 Other long term (current) drug therapy: Secondary | ICD-10-CM | POA: Diagnosis not present

## 2021-04-08 DIAGNOSIS — J449 Chronic obstructive pulmonary disease, unspecified: Secondary | ICD-10-CM | POA: Diagnosis not present

## 2021-05-08 DIAGNOSIS — J449 Chronic obstructive pulmonary disease, unspecified: Secondary | ICD-10-CM | POA: Diagnosis not present

## 2021-06-08 DIAGNOSIS — J449 Chronic obstructive pulmonary disease, unspecified: Secondary | ICD-10-CM | POA: Diagnosis not present

## 2021-07-02 DIAGNOSIS — Z743 Need for continuous supervision: Secondary | ICD-10-CM | POA: Diagnosis not present

## 2021-07-08 DIAGNOSIS — J449 Chronic obstructive pulmonary disease, unspecified: Secondary | ICD-10-CM | POA: Diagnosis not present

## 2021-08-08 DIAGNOSIS — J449 Chronic obstructive pulmonary disease, unspecified: Secondary | ICD-10-CM | POA: Diagnosis not present

## 2021-08-10 DIAGNOSIS — R5381 Other malaise: Secondary | ICD-10-CM | POA: Diagnosis not present

## 2021-08-10 DIAGNOSIS — Z743 Need for continuous supervision: Secondary | ICD-10-CM | POA: Diagnosis not present

## 2021-09-08 DIAGNOSIS — J449 Chronic obstructive pulmonary disease, unspecified: Secondary | ICD-10-CM | POA: Diagnosis not present

## 2021-09-10 DIAGNOSIS — Z743 Need for continuous supervision: Secondary | ICD-10-CM | POA: Diagnosis not present

## 2021-10-08 DIAGNOSIS — J449 Chronic obstructive pulmonary disease, unspecified: Secondary | ICD-10-CM | POA: Diagnosis not present

## 2021-11-08 DIAGNOSIS — J449 Chronic obstructive pulmonary disease, unspecified: Secondary | ICD-10-CM | POA: Diagnosis not present

## 2021-12-08 DIAGNOSIS — J449 Chronic obstructive pulmonary disease, unspecified: Secondary | ICD-10-CM | POA: Diagnosis not present

## 2022-07-08 IMAGING — DX DG CHEST 1V PORT
1 series · 1 of 1 positions shown · non-contrast
Comparison: Portable chest 05/26/2020 and earlier.

CLINICAL DATA: 65-year-old male with shortness of breath. Agitated.

EXAM:
PORTABLE CHEST 1 VIEW

[chest ap]
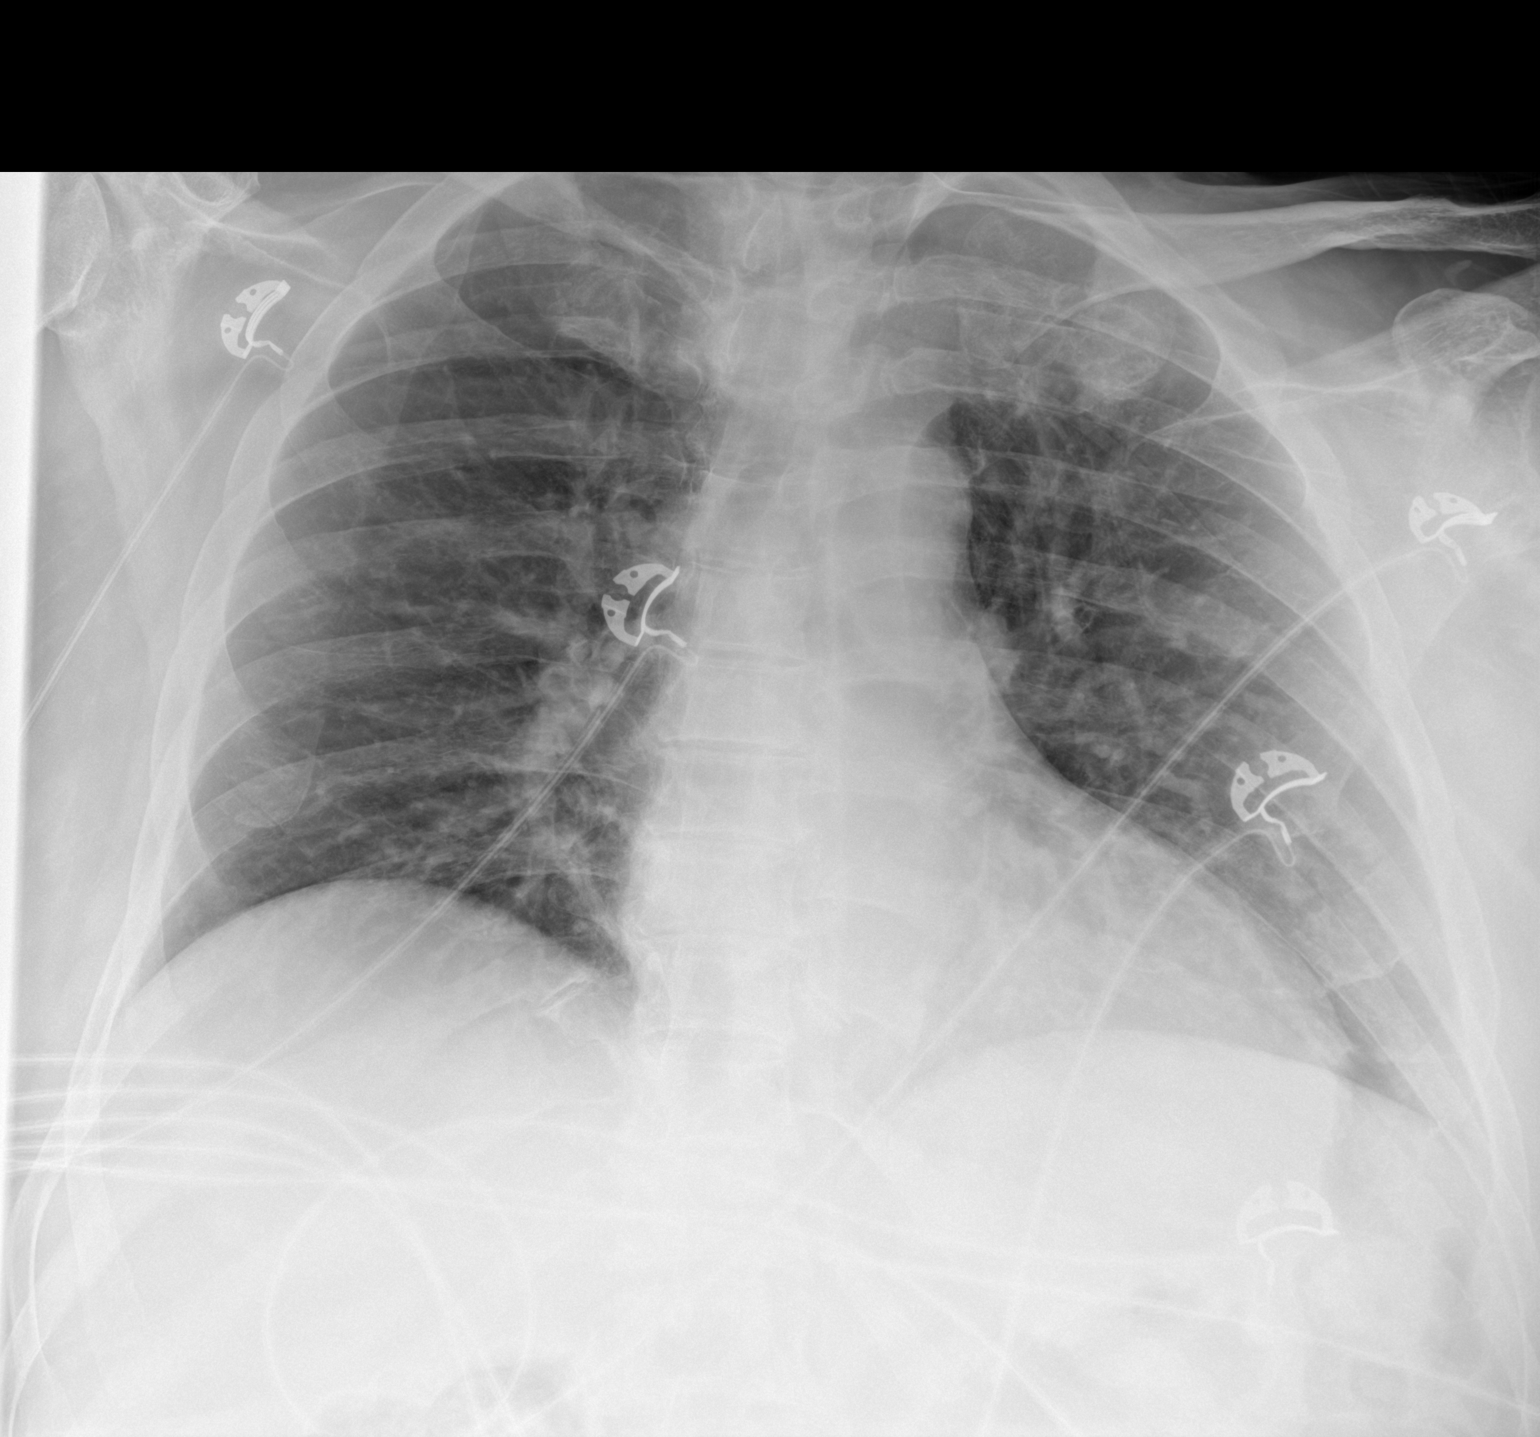

[1 of 1 positions shown; findings below may reference images not displayed]

FINDINGS: Portable AP view at [id number] hours. Improved lung volume [REDACTED].
Mediastinal contours are wit Visualized tracheal air column is
within normal limits. Allowing for portable technique the lungs are
clear. Left thoracic inlet surgical clips lateral to the trachea
might be thyroidectomy related. No acute osseous abnormality
identified. Negative visible bowel gas. Shuman normal limits.
IMPRESSION: No acute cardiopulmonary abnormality.

## 2022-07-08 IMAGING — CT CT HEAD W/O CM
4 series · 16 of 47 positions shown, 18 images · non-contrast
Comparison: Head CT 05/26/2020.

CLINICAL DATA: 65-year-old male with history of mental status
change.

EXAM:
CT HEAD WITHOUT CONTRAST
TECHNIQUE: Contiguous axial images were obtained from the base of the skull
through the vertex without intravenous contrast.

[Series 2: head wo · axial · 0.45mm/px · z∈[-137,-32]mm · 6 of 31 slices shown, 8 images]
[im 5/31  brain]
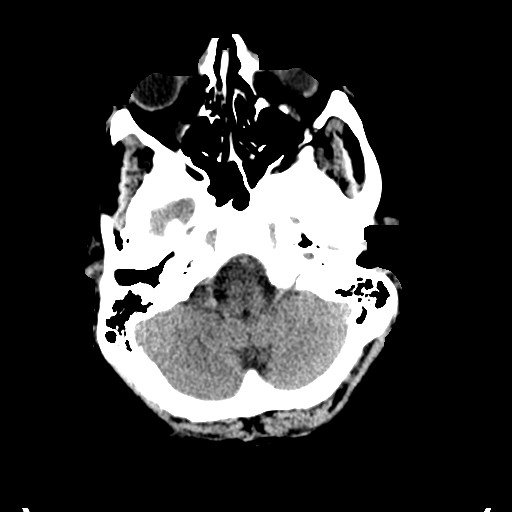
[im 5/31  bone]
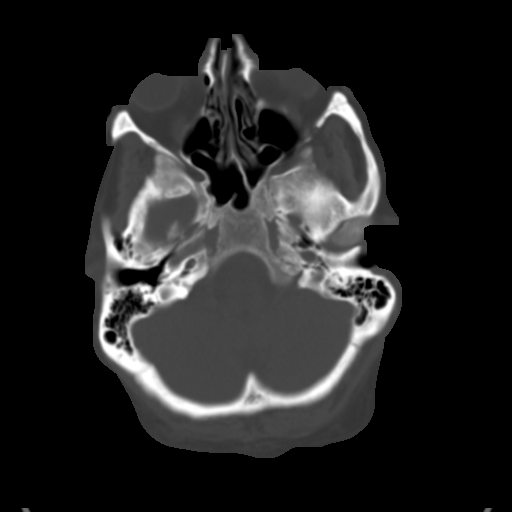
[im 9/31  brain]
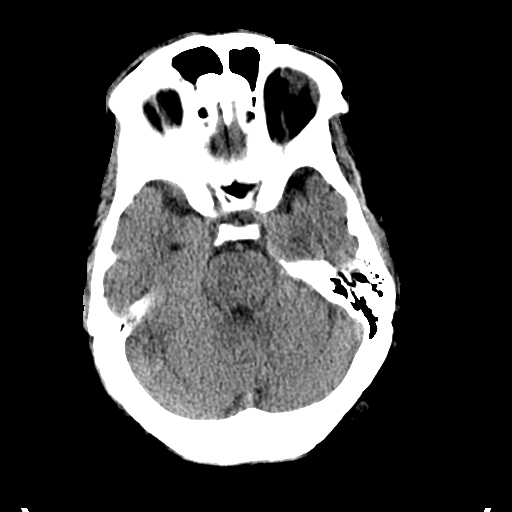
[im 13/31  brain]
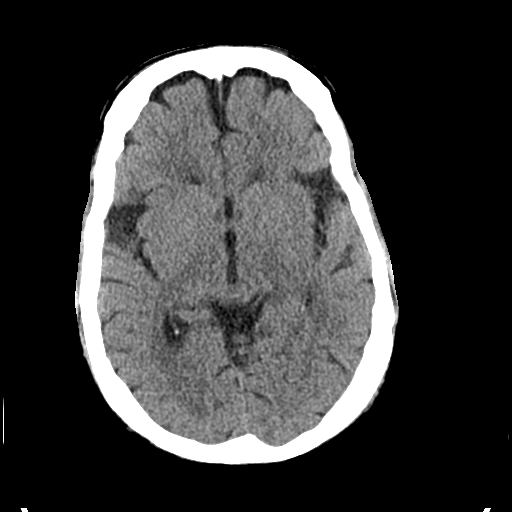
[im 18/31  brain]
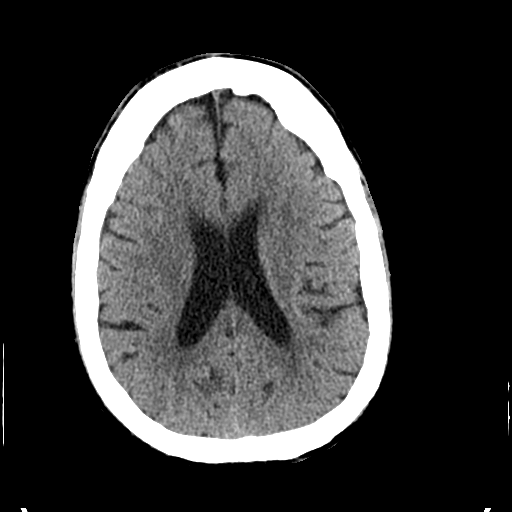
[im 22/31  brain]
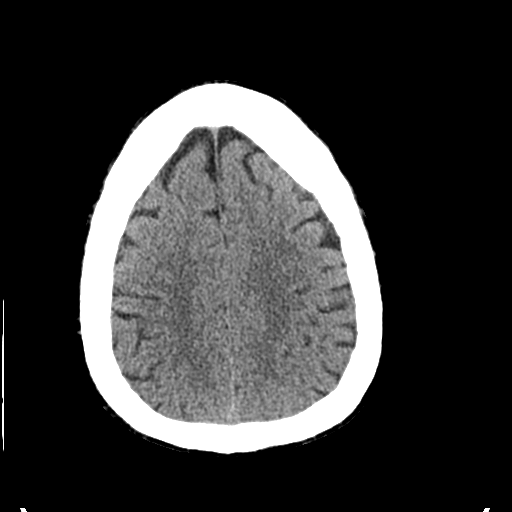
[im 22/31  bone]
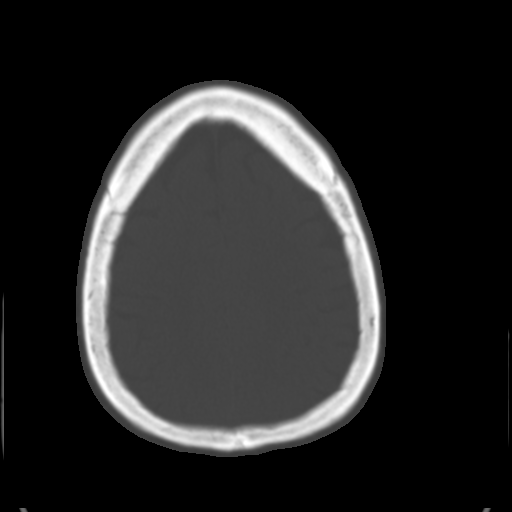
[im 26/31  brain]
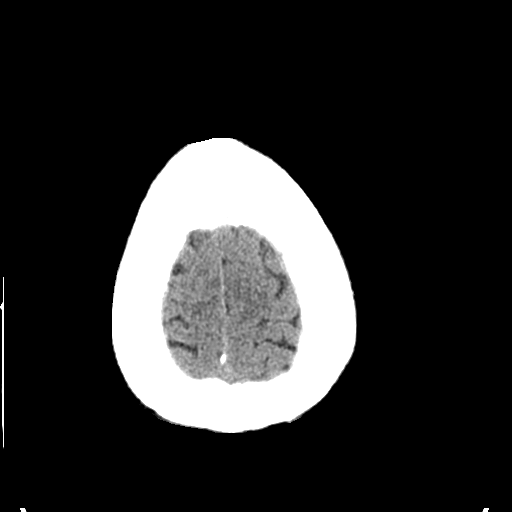

[Series 3: coronal soft tissue · coronal · 0.32mm/px · 3 of 68 slices shown]
[im 23/68  brain]
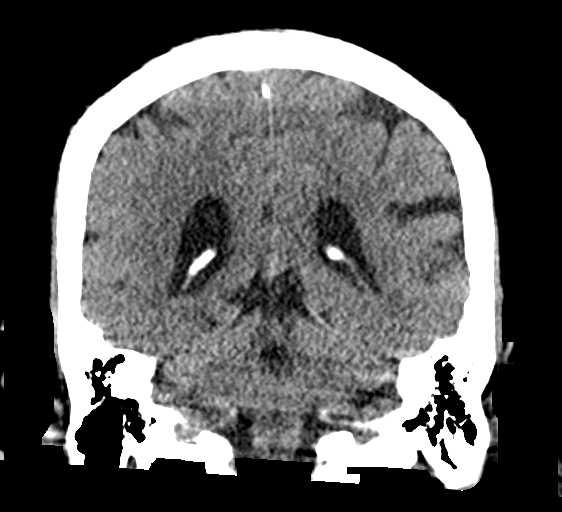
[im 30/68  brain]
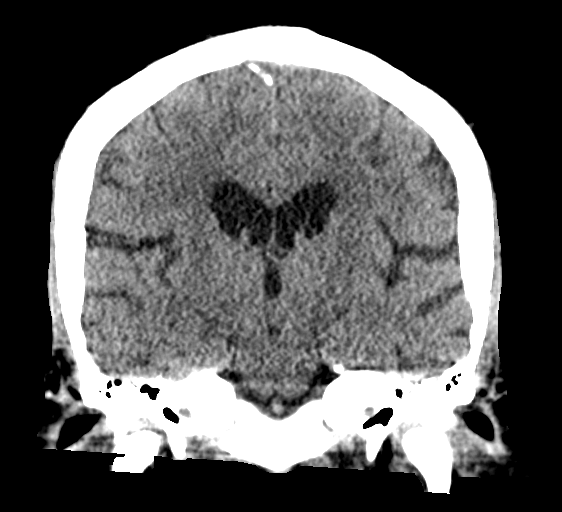
[im 38/68  brain]
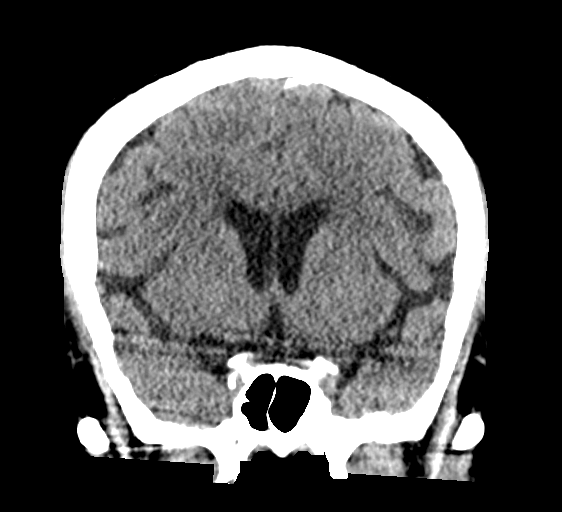

[Series 4: head bone · axial · 0.45mm/px · z∈[-143,-89]mm · 4 of 82 slices shown]
[im 8/82  bone]
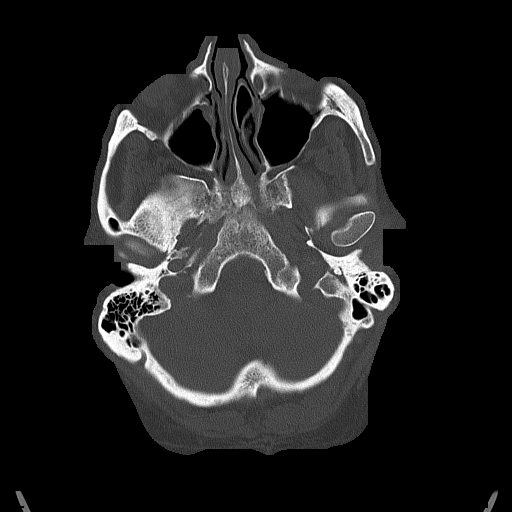
[im 16/82  bone]
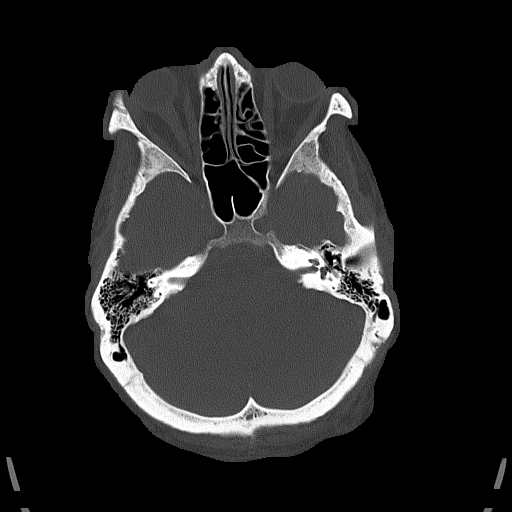
[im 28/82  bone]
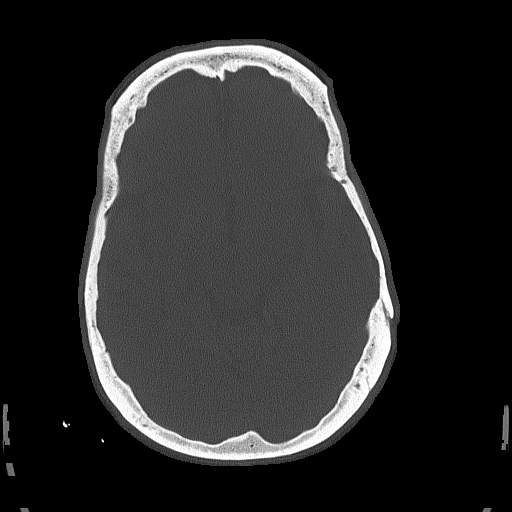
[im 35/82  bone]
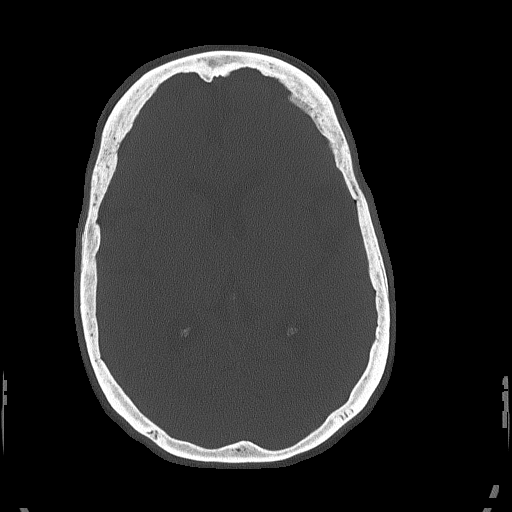

[Series 5: sagittal soft tissue · sagittal · 0.32mm/px · 3 of 60 slices shown]
[im 20/60  brain]
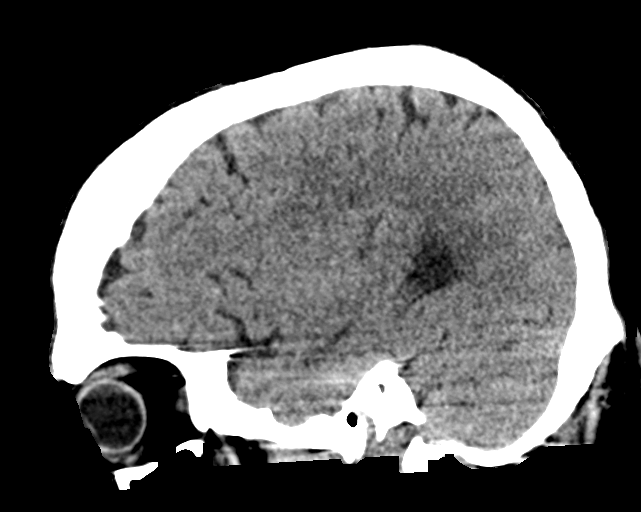
[im 30/60  brain]
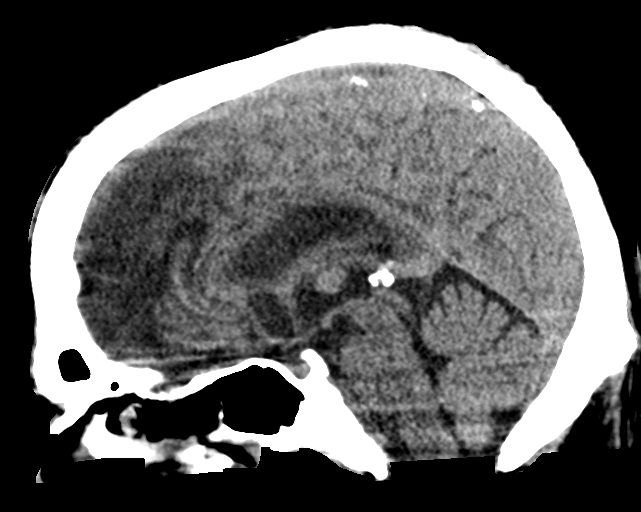
[im 40/60  brain]
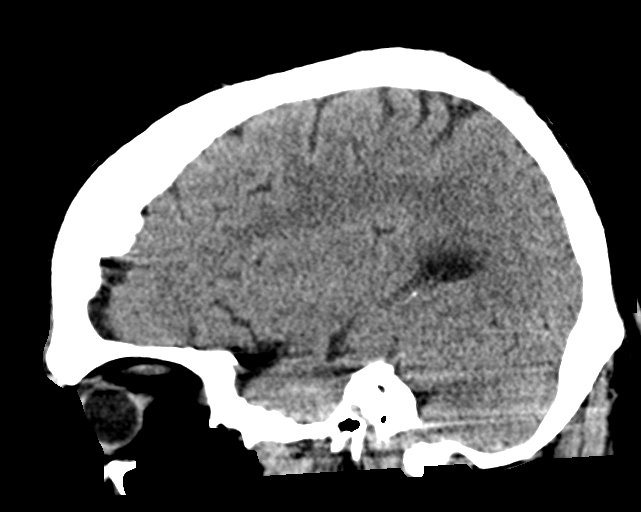

[16 of 47 positions shown; findings below may reference images not displayed]

FINDINGS: Brain: Patchy areas of decreased attenuation are noted throughout
the deep and periventricular white matter of the cerebral
hemispheres bilaterally, compatible with mild chronic microvascular
ischemic disease. No evidence of acute infarction, hemorrhage,
hydrocephalus, extra-axial collection or mass lesion/mass effect.

Vascular: No hyperdense vessel or unexpected calcification.

Skull: Normal. Negative for fracture or focal lesion.

Sinuses/Orbits: No acute finding.

Other: None.
IMPRESSION: 1. No acute intracranial abnormalities.
2. Mild chronic microvascular ischemic changes in the cerebral white
matter, as above.
